# Patient Record
Sex: Female | Born: 1975
Health system: Southern US, Community
[De-identification: ages and names within clinical notes are randomized; demographics above are authoritative.]

## PROBLEM LIST (undated history)

## (undated) DIAGNOSIS — M1990 Unspecified osteoarthritis, unspecified site: Secondary | ICD-10-CM

## (undated) DIAGNOSIS — T8859XA Other complications of anesthesia, initial encounter: Secondary | ICD-10-CM

## (undated) DIAGNOSIS — G4730 Sleep apnea, unspecified: Secondary | ICD-10-CM

## (undated) DIAGNOSIS — D649 Anemia, unspecified: Secondary | ICD-10-CM

## (undated) HISTORY — DX: Anemia, unspecified: D64.9

## (undated) HISTORY — PX: BREAST BIOPSY: SHX20

## (undated) HISTORY — PX: FOOT SURGERY: SHX648

---

## 2015-10-16 ENCOUNTER — Emergency Department (INDEPENDENT_AMBULATORY_CARE_PROVIDER_SITE_OTHER): Payer: Self-pay

## 2015-10-16 ENCOUNTER — Encounter (HOSPITAL_COMMUNITY): Payer: Self-pay | Admitting: Emergency Medicine

## 2015-10-16 ENCOUNTER — Emergency Department (HOSPITAL_COMMUNITY)
Admission: EM | Admit: 2015-10-16 | Discharge: 2015-10-16 | Disposition: A | Discharge status: Home / Self Care | Payer: Self-pay | Source: Home / Self Care | Attending: Family Medicine | Admitting: Family Medicine

## 2015-10-16 DIAGNOSIS — M779 Enthesopathy, unspecified: Secondary | ICD-10-CM

## 2015-10-16 DIAGNOSIS — M7751 Other enthesopathy of right foot: Secondary | ICD-10-CM

## 2015-10-16 MED ORDER — NAPROXEN SODIUM 550 MG PO TABS: 550.0000 mg | ORAL_TABLET | Freq: Two times a day (BID) | ORAL | Status: DC

## 2015-10-16 NOTE — Discharge Instructions (Signed)
  Arthritis Arthritis means joint pain. It can also mean joint disease. A joint is a place where bones come together. People who have arthritis may have:  Red joints.  Swollen joints.  Stiff joints.  Warm joints.  A fever.  A feeling of being sick. HOME CARE Pay attention to any changes in your symptoms. Take these actions to help with your pain and swelling. Medicines  Take over-the-counter and prescription medicines only as told by your doctor.  Do not take aspirin for pain if your doctor says that you may have gout. Activities  Rest your joint if your doctor tells you to.  Avoid activities that make the pain worse.  Exercise your joint regularly as told by your doctor. Try doing exercises like:  Swimming.  Water aerobics.  Biking.  Walking. Joint Care  If your joint is swollen, keep it raised (elevated) if told by your doctor.  If your joint feels stiff in the morning, try taking a warm shower.  If you have diabetes, do not apply heat without asking your doctor.  If told, apply heat to the joint:  Put a towel between the joint and the hot pack or heating pad.  Leave the heat on the area for 20-30 minutes.  If told, apply ice to the joint:  Put ice in a plastic bag.  Place a towel between your skin and the bag.  Leave the ice on for 20 minutes, 2-3 times per day.  Keep all follow-up visits as told by your doctor. GET HELP IF:  The pain gets worse.  You have a fever. GET HELP RIGHT AWAY IF:  You have very bad pain in your joint.  You have swelling in your joint.  Your joint is red.  Many joints become painful and swollen.  You have very bad back pain.  Your leg is very weak.  You cannot control your pee (urine) or poop (stool).   This information is not intended to replace advice given to you by your health care provider. Make sure you discuss any questions you have with your health care provider.   Document Released: 10/15/2009  Document Revised: 04/11/2015 Document Reviewed: 10/16/2014 Elsevier Interactive Patient Education 2016 [employer]  

## 2015-10-16 NOTE — ED Provider Notes (Signed)
CSN: [id number]     Arrival date & time 10/16/15  1738 History   First MD Initiated Contact with Patient 10/16/15 1922     Chief Complaint  Patient presents with  . Ankle Pain   (Consider location/radiation/quality/duration/timing/severity/associated sxs/prior Treatment) HPI History obtained from patient:   LOCATION:right foot SEVERITY:5 DURATION:months, but getting worse CONTEXT:was told she had arthritis in the foot in the past, now working on concrete floors 8 hours a day, pain in foot is worse and now it swells daily QUALITY: MODIFYING FACTORS:massage with alcohol and pressure wrap ASSOCIATED SYMPTOMS:hurts to walk TIMING:episodic OCCUPATION:kitchen worker  History reviewed. No pertinent past medical history. History reviewed. No pertinent past surgical history. History reviewed. No pertinent family history. Social History  Substance Use Topics  . Smoking status: Never Smoker   . Smokeless tobacco: None  . Alcohol Use: Yes   OB History    No data available     Review of Systems Chronic right foot pain Allergies  Review of patient's allergies indicates no known allergies.  Home Medications   Prior to Admission medications   Medication Sig Start Date End Date Taking? Authorizing Provider  naproxen sodium (ANAPROX DS) 550 MG tablet Take 1 tablet (550 mg total) by mouth 2 (two) times daily with a meal. 10/16/15   Tharon Aquas, PA   Meds Ordered and Administered this Visit  Medications - No data to display  BP 117/54 mmHg  Pulse 77  Temp(Src) 97.7 F (36.5 C) (Oral)  Resp 16  SpO2 100%  LMP 10/02/2015 (Exact Date) No data found.   Physical Exam  Constitutional: She appears well-developed. No distress.  Pulmonary/Chest: Effort normal.  Musculoskeletal: Normal range of motion. She exhibits tenderness.       Feet:  Neurological: She is alert.  Skin: Skin is warm and dry.  Psychiatric: She has a normal mood and affect. Her behavior is normal.  Nursing  note and vitals reviewed.   ED Course  Procedures (including critical care time)  Labs Review Labs Reviewed - No data to display  Imaging Review Dg Ankle Complete Right  10/16/2015  CLINICAL DATA:  Bruising and swelling along the side of the foot. Arthritis. EXAM: RIGHT ANKLE - COMPLETE 3+ VIEW COMPARISON:  None. FINDINGS: Inferior spurring from the medial malleolus best appreciated on the lateral projection. Plafond and talar dome intact. Lateral malleolus unremarkable. There is questionable soft tissue swelling below the level of the medial malleolus. Considerable dorsal spurring at the talonavicular articulation, chronic in appearance. Os peroneus noted. Mild plantar and Achilles calcaneal spurs. IMPRESSION: 1. Spurring of the medial malleolus and considerable dorsal spurring of the talonavicular articulation. 2. Smaller plantar and Achilles calcaneal spurs. 3. No acute radiographic findings. Electronically Signed   By: Gaylyn Rong M.D.   On: 10/16/2015 19:55     Visual Acuity Review  Right Eye Distance:   Left Eye Distance:   Bilateral Distance:    Right Eye Near:   Left Eye Near:    Bilateral Near:        Review of XR with patient CAM walker application Refer to ortho MDM   1. Bone spur of right ankle     Patient is reassured that there are no issues that require transfer to higher level of care at this time or additional tests. Patient is advised to continue home symptomatic treatment. Patient is advised that if there are new or worsening symptoms to attend the emergency department, contact primary care provider, or return  to UC. Instructions of care provided discharged home in stable condition. Return to work/school note provided.   THIS NOTE WAS GENERATED USING A VOICE RECOGNITION SOFTWARE PROGRAM. ALL REASONABLE EFFORTS  WERE MADE TO PROOFREAD THIS DOCUMENT FOR ACCURACY.  I have verbally reviewed the discharge instructions with the patient. A printed AVS  was given to the patient.  All questions were answered prior to discharge.      Tharon AquasFrank C Patrick, GeorgiaPA 10/16/15 2034

## 2015-10-16 NOTE — ED Notes (Signed)
[facility] a complaint of right ankle pain that has been ongoing since November. The patient stated that the pain has gotten increasingly worse. The patient denied any known injury but stated that she has a hx of arthritis. There was no obvious swelling noted and good PMS present.

## 2016-09-16 ENCOUNTER — Encounter (HOSPITAL_COMMUNITY): Payer: Self-pay | Admitting: Emergency Medicine

## 2016-09-16 ENCOUNTER — Emergency Department (HOSPITAL_COMMUNITY): Payer: BLUE CROSS/BLUE SHIELD

## 2016-09-16 DIAGNOSIS — M25571 Pain in right ankle and joints of right foot: Secondary | ICD-10-CM | POA: Diagnosis not present

## 2016-09-16 NOTE — ED Triage Notes (Signed)
Arrives with complaint of right ankle pain. States onset 2 days ago. Mild swelling noted to ankle. Area tender. Denies injury.

## 2016-09-17 ENCOUNTER — Emergency Department (HOSPITAL_COMMUNITY)
Admission: EM | Admit: 2016-09-17 | Discharge: 2016-09-17 | Disposition: A | Discharge status: Home / Self Care | Payer: BLUE CROSS/BLUE SHIELD | Attending: Emergency Medicine | Admitting: Emergency Medicine

## 2016-09-17 DIAGNOSIS — M25571 Pain in right ankle and joints of right foot: Secondary | ICD-10-CM

## 2016-09-17 MED ORDER — DICLOFENAC SODIUM 50 MG PO TBEC: 50.0000 mg | DELAYED_RELEASE_TABLET | Freq: Two times a day (BID) | ORAL | 0 refills | Status: DC

## 2016-09-17 NOTE — ED Provider Notes (Signed)
[facility]-EMERGENCY DEPT Provider Note   CSN: [id number] Arrival date & time: 09/16/16  2316     History   Chief Complaint Chief Complaint  Patient presents with  . Ankle Pain    HPI Kathleen Ramos is a 41 y.o. female who presents to the ED with right ankle pain that started 2 days ago. She reports having had ankle problems in the past. She has no known injury to the ankle. The pain is located on the medial aspect of the right ankle.   The history is provided by the patient. No language interpreter was used.  Ankle Pain   The incident occurred 2 days ago. The incident occurred at work. There was no injury mechanism. The pain is present in the right ankle. The quality of the pain is described as aching. The pain is at a severity of 10/10. The pain has been constant since onset. She reports no foreign bodies present. The symptoms are aggravated by bearing weight.  patient reports taking one ibuprofen tablet for pain without relief.   History reviewed. No pertinent past medical history.  There are no active problems to display for this patient.   History reviewed. No pertinent surgical history.  OB History    No data available       Home Medications    Prior to Admission medications   Medication Sig Start Date End Date Taking? Authorizing Provider  diclofenac (VOLTAREN) 50 MG EC tablet Take 1 tablet (50 mg total) by mouth 2 (two) times daily. 09/17/16   Phares Zaccone Orlene OchM Javonda Suh, NP    Family History History reviewed. No pertinent family history.  Social History Social History  Substance Use Topics  . Smoking status: Never Smoker  . Smokeless tobacco: Never Used  . Alcohol use Yes     Allergies   Patient has no known allergies.   Review of Systems Review of Systems  Constitutional: Negative for chills and fever.  Musculoskeletal: Positive for arthralgias. Gait problem: due to pain in right ankle.  Skin: Negative for wound.     Physical Exam Updated Vital  Signs BP 135/69 (BP Location: Left Arm)   Pulse 94   Temp 98.4 F (36.9 C) (Oral)   Resp 20   Ht 5\' 7"  (1.702 m)   Wt 124.3 kg   LMP 08/20/2016 (Approximate)   SpO2 97%   BMI 42.91 kg/m   Physical Exam  Constitutional: She is oriented to person, place, and time. No distress.  obese  HENT:  Head: Normocephalic.  Eyes: EOM are normal.  Neck: Neck supple.  Cardiovascular: Normal rate.   Pulmonary/Chest: Effort normal.  Musculoskeletal: She exhibits no deformity.       Right ankle: She exhibits normal range of motion, no swelling, no ecchymosis, no deformity, no laceration and normal pulse. Tenderness. Medial malleolus tenderness found. Achilles tendon normal.  Pedal pulses 2+, adequate circulation, equal strength in lower extremities. Pain with inversion of the right ankle.  Neurological: She is alert and oriented to person, place, and time. No cranial nerve deficit.  Skin: Skin is warm and dry.  Psychiatric: She has a normal mood and affect. Her behavior is normal.  Nursing note and vitals reviewed.    ED Treatments / Results  Labs (all labs ordered are listed, but only abnormal results are displayed) Labs Reviewed - No data to display  Radiology Dg Ankle Complete Right  Result Date: 09/16/2016 CLINICAL DATA:  Lateral ankle pain for 3 days.  No known injury. EXAM:  RIGHT ANKLE - COMPLETE 3+ VIEW COMPARISON:  10/16/2015 FINDINGS: Degenerative changes in the right ankle with prominent osteophyte formation along the medial malleolus. Degenerative changes in the intertarsal joints. Achilles calcaneal spur. Linear lucency in the body of the talus likely to represent old pin tract. This was present previously. No evidence of acute fracture or dislocation. No focal bone destruction. Soft tissues are unremarkable. IMPRESSION: Degenerative changes in the right ankle. No acute bony abnormalities. Electronically Signed   By: Burman Nieves M.D.   On: 09/16/2016 23:53     Procedures Procedures (including critical care time)  Medications Ordered in ED Medications - No data to display   Initial Impression / Assessment and Plan / ED Course  I have reviewed the triage vital signs and the nursing notes.  Pertinent imaging results that were available during my care of the patient were reviewed by me and considered in my medical decision making (see chart for details).   Final Clinical Impressions(s) / ED Diagnoses  41 y.o. female with right ankle pain stable for d/c without focal neuro deficits and no acute findings on x-ray. Patient given ASO prior to d/c and Rx for NSAIDS.  Ortho referral given and return precautions.   Final diagnoses:  Acute right ankle pain    New Prescriptions New Prescriptions   DICLOFENAC (VOLTAREN) 50 MG EC TABLET    Take 1 tablet (50 mg total) by mouth 2 (two) times daily.     [address] [place], [place] 09/17/16 0143    Dione Booze, MD 09/17/16 [phone]

## 2016-10-01 ENCOUNTER — Encounter: Payer: Self-pay | Admitting: Adult Health

## 2016-10-01 ENCOUNTER — Ambulatory Visit (INDEPENDENT_AMBULATORY_CARE_PROVIDER_SITE_OTHER): Payer: BLUE CROSS/BLUE SHIELD | Admitting: Adult Health

## 2016-10-01 VITALS — BP 108/68 | HR 87 | Ht 67.25 in | Wt 283.7 lb

## 2016-10-01 DIAGNOSIS — N912 Amenorrhea, unspecified: Secondary | ICD-10-CM

## 2016-10-01 DIAGNOSIS — M25571 Pain in right ankle and joints of right foot: Secondary | ICD-10-CM | POA: Diagnosis not present

## 2016-10-01 DIAGNOSIS — Z0000 Encounter for general adult medical examination without abnormal findings: Secondary | ICD-10-CM | POA: Insufficient documentation

## 2016-10-01 DIAGNOSIS — Z833 Family history of diabetes mellitus: Secondary | ICD-10-CM | POA: Diagnosis not present

## 2016-10-01 DIAGNOSIS — R5383 Other fatigue: Secondary | ICD-10-CM

## 2016-10-01 LAB — POCT URINE PREGNANCY: Preg Test, Ur: NEGATIVE

## 2016-10-01 LAB — POCT GLYCOSYLATED HEMOGLOBIN (HGB A1C): Hemoglobin A1C: 5.6

## 2016-10-01 MED ORDER — DICLOFENAC SODIUM 1 % TD GEL: 2.0000 g | Freq: Four times a day (QID) | TRANSDERMAL | 1 refills | Status: DC

## 2016-10-01 NOTE — Assessment & Plan Note (Signed)
Increase regular exercise, recommend 30 mins at least  5 times a week. Increase water intake at least 90 ounces a day-recommended at 140 ounces per her body weight, however she said she would never be able to drink that daily. Schedule fasting lab nurse visit at your convenience.

## 2016-10-01 NOTE — Progress Notes (Signed)
Subjective:    Patient ID: Kathleen Ramos, female    DOB: 06/20/1976, 41 y.o.   MRN: [id number]  HPI:  Ms, Bascom LevelsFrazier presents to establish as a new pt.  She is a pleasant 41 year old.  PMH: Fatigue, Morbid Obesity, and right ankle pain.  She reports > 50 lb weight gain in the last year.  Right ankle pain spontaneously developed last year, denies acute trauma/injury.  Pain is constant, 8/10 localized over top of right ankle.  [facility], next appt. Is in a few weeks. Significant family hx of DM-father, mother, brother.  She works as Government social research officerdietary aid at [employer] and she is getting married this June.    Patient Care Team    Relationship Specialty Notifications Start End  No Pcp Per Patient PCP - General General Practice  10/16/15     Patient Active Problem List   Diagnosis Date Noted  . Family history of diabetes mellitus in first degree relative 10/01/2016  . Other fatigue 10/01/2016  . Routine physical examination 10/01/2016  . Morbid obesity (HCC) 10/01/2016  . Right ankle pain 10/01/2016     History reviewed. No pertinent past medical history.   History reviewed. No pertinent surgical history.   Family History  Problem Relation Age of Onset  . Diabetes Mother   . Diabetes Father   . Stroke Father   . Healthy Sister   . Diabetes Brother   . Stroke Brother   . Seizures Brother   . Healthy Brother      History  Drug Use No     History  Alcohol Use  . Yes    Comment: occassionally     History  Smoking Status  . Never Smoker  Smokeless Tobacco  . Never Used     Outpatient Encounter Prescriptions as of 10/01/2016  Medication Sig  . diclofenac (VOLTAREN) 50 MG EC tablet Take 1 tablet (50 mg total) by mouth 2 (two) times daily.  . diclofenac sodium (VOLTAREN) 1 % GEL Apply 2 g topically 4 (four) times daily.   No facility-administered encounter medications on file as of 10/01/2016.     Allergies: Patient has no  known allergies.  Body mass index is 44.1 kg/m.  Blood pressure 108/68, pulse 87, height 5' 7.25" (1.708 m), weight 283 lb 11.2 oz (128.7 kg), last menstrual period 08/20/2016.    Review of Systems  Constitutional: Positive for activity change, fatigue and unexpected weight change. Negative for appetite change, chills, diaphoresis and fever.  HENT: Negative for congestion.   Eyes: Negative for visual disturbance.  Respiratory: Negative for cough, chest tightness and shortness of breath.   Cardiovascular: Negative for chest pain, palpitations and leg swelling.  Gastrointestinal: Negative for abdominal distention, abdominal pain, constipation, diarrhea, nausea and vomiting.  Endocrine: Negative for cold intolerance, heat intolerance, polydipsia and polyuria.  Genitourinary: Positive for menstrual problem. Negative for difficulty urinating and flank pain.       Two weeks late on menstrual cycle. HCG negative in clinic today.  Musculoskeletal: Positive for arthralgias, gait problem and myalgias. Negative for back pain, joint swelling, neck pain and neck stiffness.  Skin: Negative for color change, pallor, rash and wound.  Neurological: Negative for dizziness, tremors, weakness and headaches.  Psychiatric/Behavioral: Negative for agitation, behavioral problems, self-injury, sleep disturbance and suicidal ideas. The patient is not nervous/anxious and is not hyperactive.        Objective:   Physical Exam  Constitutional: She  appears well-developed and well-nourished. No distress.  HENT:  Head: Normocephalic and atraumatic.  Right Ear: External ear normal.  Left Ear: External ear normal.  Poor dentition.  Eyes: Conjunctivae and EOM are normal. Pupils are equal, round, and reactive to light.  Neck: Normal range of motion. Neck supple.  Cardiovascular: Normal rate, regular rhythm, normal heart sounds and intact distal pulses.   Pulmonary/Chest: Effort normal and breath sounds normal. No  respiratory distress. She has no wheezes. She has no rales. She exhibits no tenderness.  Abdominal: Soft. Bowel sounds are normal. She exhibits no distension. There is no tenderness.  Protuberant abdomin.  Musculoskeletal: Normal range of motion. She exhibits tenderness. She exhibits no edema.       Right ankle: She exhibits normal range of motion, no swelling, no ecchymosis, no deformity, no laceration and normal pulse. Tenderness. Achilles tendon exhibits no pain.  Ankle pain with active ROM, especially during  Inversion. Tenderness noted over dome of talus. No edema, warmth, redness noted.  Lymphadenopathy:    She has no cervical adenopathy.  Skin: She is not diaphoretic.  Nursing note and vitals reviewed.         Assessment & Plan:   1. Amenorrhea   2. Family history of diabetes mellitus in first degree relative   3. Other fatigue   4. Routine physical examination   5. Morbid obesity (HCC)   6. Right ankle pain, unspecified chronicity     Family history of diabetes mellitus in first degree relative A1c today 5.6  Other fatigue Will check TSH.  Routine physical examination Increase regular exercise, recommend 30 mins at least  5 times a week. Increase water intake at least 90 ounces a day-recommended at 140 ounces per her body weight, however she said she would never be able to drink that daily. Schedule fasting lab nurse visit at your convenience.   Morbid obesity (HCC) Increase regular exercise, recommend 30 mins at least  5 times a week. Increase water intake at least 90 ounces a day-recommended at 140 ounces per her body weight, however she said she would never be able to drink that daily.  Right ankle pain  Diclofenac Gel as directed. Continue with Orthopedic Specialist as directed.    FOLLOW-UP:  Return in about 1 year (around 10/01/2017) for Regular Follow Up.

## 2016-10-01 NOTE — Patient Instructions (Addendum)
Heart-Healthy Eating Plan °Heart-healthy meal planning includes: °· Limiting unhealthy fats. °· Increasing healthy fats. °· Making other small dietary changes. °You may need to talk with your doctor or a diet specialist (dietitian) to create an eating plan that is right for you. °What types of fat should I choose? °· Choose healthy fats. These include olive oil and canola oil, flaxseeds, walnuts, almonds, and seeds. °· Eat more omega-3 fats. These include salmon, mackerel, sardines, tuna, flaxseed oil, and ground flaxseeds. Try to eat fish at least twice each week. °· Limit saturated fats. °¨ Saturated fats are often found in animal products, such as meats, butter, and cream. °¨ Plant sources of saturated fats include palm oil, palm kernel oil, and coconut oil. °· Avoid foods with partially hydrogenated oils in them. These include stick margarine, some tub margarines, cookies, crackers, and other baked goods. These contain trans fats. °What general guidelines do I need to follow? °· Check food labels carefully. Identify foods with trans fats or high amounts of saturated fat. °· Fill one half of your plate with vegetables and green salads. Eat 4-5 servings of vegetables per day. A serving of vegetables is: °¨ 1 cup of raw leafy vegetables. °¨ ½ cup of raw or cooked cut-up vegetables. °¨ ½ cup of vegetable juice. °· Fill one fourth of your plate with whole grains. Look for the word "whole" as the first word in the ingredient list. °· Fill one fourth of your plate with lean protein foods. °· Eat 4-5 servings of fruit per day. A serving of fruit is: °¨ One medium whole fruit. °¨ ¼ cup of dried fruit. °¨ ½ cup of fresh, frozen, or canned fruit. °¨ ½ cup of 100% fruit juice. °· Eat more foods that contain soluble fiber. These include apples, broccoli, carrots, beans, peas, and barley. Try to get 20-30 g of fiber per day. °· Eat more home-cooked food. Eat less restaurant, buffet, and fast food. °· Limit or avoid  alcohol. °· Limit foods high in starch and sugar. °· Avoid fried foods. °· Avoid frying your food. Try baking, boiling, grilling, or broiling it instead. You can also reduce fat by: °¨ Removing the skin from poultry. °¨ Removing all visible fats from meats. °¨ Skimming the fat off of stews, soups, and gravies before serving them. °¨ Steaming vegetables in water or broth. °· Lose weight if you are overweight. °· Eat 4-5 servings of nuts, legumes, and seeds per week: °¨ One serving of dried beans or legumes equals ½ cup after being cooked. °¨ One serving of nuts equals 1½ ounces. °¨ One serving of seeds equals ½ ounce or one tablespoon. °· You may need to keep track of how much salt or sodium you eat. This is especially true if you have high blood pressure. Talk with your doctor or dietitian to get more information. °What foods can I eat? °Grains  °Breads, including French, white, pita, wheat, raisin, rye, oatmeal, and Italian. Tortillas that are neither fried nor made with lard or trans fat. Low-fat rolls, including hotdog and hamburger buns and English muffins. Biscuits. Muffins. Waffles. Pancakes. Light popcorn. Whole-grain cereals. Flatbread. Melba toast. Pretzels. Breadsticks. Rusks. Low-fat snacks. Low-fat crackers, including oyster, saltine, matzo, graham, animal, and rye. Rice and pasta, including [place] rice and pastas that are made with whole wheat. °Vegetables  °All vegetables. °Fruits  °All fruits, but limit coconut. °Meats and Other Protein Sources  °Lean, well-trimmed beef, veal, pork, and lamb. Chicken and turkey without skin. All   fish and shellfish. Wild duck, rabbit, pheasant, and venison. Egg whites or low-cholesterol egg substitutes. Dried beans, peas, lentils, and tofu. Seeds and most nuts. Dairy  Low-fat or nonfat cheeses, including ricotta, string, and mozzarella. Skim or 1% milk that is liquid, powdered, or evaporated. Buttermilk that is made with low-fat milk. Nonfat or low-fat  yogurt. Beverages  Mineral water. Diet carbonated beverages. Sweets and Desserts  Sherbets and fruit ices. Honey, jam, marmalade, jelly, and syrups. Meringues and gelatins. Pure sugar candy, such as hard candy, jelly beans, gumdrops, mints, marshmallows, and small amounts of dark chocolate. MGM MIRAGE[employer]el food cake. Eat all sweets and desserts in moderation. Fats and Oils  Nonhydrogenated (trans-free) margarines. Vegetable oils, including soybean, sesame, sunflower, olive, peanut, safflower, corn, canola, and cottonseed. Salad dressings or mayonnaise made with a vegetable oil. Limit added fats and oils that you use for cooking, baking, salads, and as spreads. Other  Cocoa powder. Coffee and tea. All seasonings and condiments. The items listed above may not be a complete list of recommended foods or beverages. Contact your dietitian for more options.  What foods are not recommended? Grains  Breads that are made with saturated or trans fats, oils, or whole milk. Croissants. Butter rolls. Cheese breads. Sweet rolls. Donuts. Buttered popcorn. Chow mein noodles. High-fat crackers, such as cheese or butter crackers. Meats and Other Protein Sources  Fatty meats, such as hotdogs, short ribs, sausage, spareribs, bacon, rib eye roast or steak, and mutton. High-fat deli meats, such as salami and bologna. Caviar. Domestic duck and goose. Organ meats, such as kidney, liver, sweetbreads, and heart. Dairy  Cream, sour cream, cream cheese, and creamed cottage cheese. Whole-milk cheeses, including blue (bleu), [address] [place], [place], [place], [address], [place], [address], cheddar, [place], and [place]. Whole or 2% milk that is liquid, evaporated, or condensed. Whole buttermilk. Cream sauce or high-fat cheese sauce. Yogurt that is made from whole milk. Beverages  Regular sodas and juice drinks with added sugar. Sweets and Desserts  Frosting. Pudding. Cookies. Cakes other than angel food cake. Candy that has milk chocolate or  white chocolate, hydrogenated fat, butter, coconut, or unknown ingredients. Buttered syrups. Full-fat ice cream or ice cream drinks. Fats and Oils  Gravy that has suet, meat fat, or shortening. Cocoa butter, hydrogenated oils, palm oil, coconut oil, palm kernel oil. These can often be found in baked products, candy, fried foods, nondairy creamers, and whipped toppings. Solid fats and shortenings, including bacon fat, salt pork, lard, and butter. Nondairy cream substitutes, such as coffee creamers and sour cream substitutes. Salad dressings that are made of unknown oils, cheese, or sour cream. The items listed above may not be a complete list of foods and beverages to avoid. Contact your dietitian for more information.  This information is not intended to replace advice given to you by your health care provider. Make sure you discuss any questions you have with your health care provider. Document Released: 01/20/2012 Document Revised: 12/27/2015 Document Reviewed: 01/12/2014 Elsevier Interactive Patient Education  2017 Elsevier Inc.  Increase regular exercise, recommend 30 mins at least 5 times a week. Increase water intake at least 90 ounces a day. Continue with Orthopedic Specialist as directed. Please schedule fasting lab nurse visit at your convenience. Annual follow-up, sooner if needed.

## 2016-10-01 NOTE — Assessment & Plan Note (Addendum)
Increase regular exercise, recommend 30 mins at least  5 times a week. Increase water intake at least 90 ounces a day-recommended at 140 ounces per her body weight, however she said she would never be able to drink that daily.

## 2016-10-01 NOTE — Assessment & Plan Note (Signed)
A1c today 5.6

## 2016-10-01 NOTE — Assessment & Plan Note (Signed)
Will check TSH

## 2016-10-01 NOTE — Assessment & Plan Note (Signed)
Diclofenac Gel as directed. Continue with Orthopedic Specialist as directed.

## 2016-10-21 ENCOUNTER — Other Ambulatory Visit (INDEPENDENT_AMBULATORY_CARE_PROVIDER_SITE_OTHER): Payer: BLUE CROSS/BLUE SHIELD

## 2016-10-21 DIAGNOSIS — R5383 Other fatigue: Secondary | ICD-10-CM

## 2016-10-21 DIAGNOSIS — Z0000 Encounter for general adult medical examination without abnormal findings: Secondary | ICD-10-CM

## 2016-10-22 ENCOUNTER — Other Ambulatory Visit: Payer: Self-pay | Admitting: Adult Health

## 2016-10-22 DIAGNOSIS — E559 Vitamin D deficiency, unspecified: Secondary | ICD-10-CM

## 2016-10-22 LAB — COMPREHENSIVE METABOLIC PANEL
ALT: 8 IU/L (ref 0–32)
AST: 26 IU/L (ref 0–40)
Albumin/Globulin Ratio: 1.2 (ref 1.2–2.2)
Albumin: 3.9 g/dL (ref 3.5–5.5)
Alkaline Phosphatase: 59 IU/L (ref 39–117)
BILIRUBIN TOTAL: 0.3 mg/dL (ref 0.0–1.2)
BUN/Creatinine Ratio: 23 (ref 9–23)
BUN: 13 mg/dL (ref 6–20)
CHLORIDE: 107 mmol/L — AB (ref 96–106)
CO2: 19 mmol/L (ref 18–29)
Calcium: 9.3 mg/dL (ref 8.7–10.2)
Creatinine, Ser: 0.56 mg/dL — ABNORMAL LOW (ref 0.57–1.00)
GFR calc non Af Amer: 119 mL/min/{1.73_m2} (ref >59)
GFR, EST AFRICAN AMERICAN: 137 mL/min/{1.73_m2} (ref >59)
GLOBULIN, TOTAL: 3.2 g/dL (ref 1.5–4.5)
Glucose: 104 mg/dL — ABNORMAL HIGH (ref 65–99)
Potassium: 5.4 mmol/L — ABNORMAL HIGH (ref 3.5–5.2)
SODIUM: 145 mmol/L — AB (ref 134–144)
Total Protein: 7.1 g/dL (ref 6.0–8.5)

## 2016-10-22 LAB — TSH: TSH: 1.88 u[IU]/mL (ref 0.450–4.500)

## 2016-10-22 LAB — LIPID PANEL
CHOL/HDL RATIO: 3.9 ratio (ref 0.0–4.4)
Cholesterol, Total: 228 mg/dL — ABNORMAL HIGH (ref 100–199)
HDL: 58 mg/dL (ref >39)
LDL Calculated: 150 mg/dL — ABNORMAL HIGH (ref 0–99)
TRIGLYCERIDES: 98 mg/dL (ref 0–149)
VLDL Cholesterol Cal: 20 mg/dL (ref 5–40)

## 2016-10-22 LAB — CBC WITH DIFFERENTIAL/PLATELET

## 2016-10-22 LAB — VITAMIN D 25 HYDROXY (VIT D DEFICIENCY, FRACTURES): VIT D 25 HYDROXY: 9.6 ng/mL — AB (ref 30.0–100.0)

## 2016-10-22 MED ORDER — VITAMIN D (ERGOCALCIFEROL) 1.25 MG (50000 UNIT) PO CAPS: 50000.0000 [IU] | ORAL_CAPSULE | ORAL | 0 refills | Status: DC

## 2016-10-22 NOTE — Progress Notes (Unsigned)
ergo 

## 2018-10-01 DIAGNOSIS — M810 Age-related osteoporosis without current pathological fracture: Secondary | ICD-10-CM | POA: Insufficient documentation

## 2019-05-23 ENCOUNTER — Other Ambulatory Visit: Payer: Self-pay

## 2019-05-23 ENCOUNTER — Ambulatory Visit (INDEPENDENT_AMBULATORY_CARE_PROVIDER_SITE_OTHER): Payer: Managed Care, Other (non HMO) | Admitting: Adult Health

## 2019-05-23 ENCOUNTER — Encounter: Payer: Self-pay | Admitting: Adult Health

## 2019-05-23 VITALS — BP 131/79 | HR 69 | Temp 98.9°F | Ht 67.5 in | Wt 267.2 lb

## 2019-05-23 DIAGNOSIS — R0683 Snoring: Secondary | ICD-10-CM | POA: Diagnosis not present

## 2019-05-23 DIAGNOSIS — E559 Vitamin D deficiency, unspecified: Secondary | ICD-10-CM

## 2019-05-23 DIAGNOSIS — R400 Somnolence: Secondary | ICD-10-CM

## 2019-05-23 DIAGNOSIS — Z0000 Encounter for general adult medical examination without abnormal findings: Secondary | ICD-10-CM | POA: Insufficient documentation

## 2019-05-23 DIAGNOSIS — R0681 Apnea, not elsewhere classified: Secondary | ICD-10-CM

## 2019-05-23 DIAGNOSIS — Z6838 Body mass index (BMI) 38.0-38.9, adult: Secondary | ICD-10-CM | POA: Insufficient documentation

## 2019-05-23 DIAGNOSIS — Z23 Encounter for immunization: Secondary | ICD-10-CM

## 2019-05-23 DIAGNOSIS — Z1231 Encounter for screening mammogram for malignant neoplasm of breast: Secondary | ICD-10-CM

## 2019-05-23 DIAGNOSIS — Z6841 Body Mass Index (BMI) 40.0 and over, adult: Secondary | ICD-10-CM

## 2019-05-23 NOTE — Assessment & Plan Note (Signed)
Continue your excellent water intake, regular walking, and follow Mediterranean diet. Referral [facility]

## 2019-05-23 NOTE — Progress Notes (Signed)
Subjective:    Patient ID: Kathleen Ramos, female    DOB: [birth year], 43 y.o.   MRN: [id number]  HPI: 10/01/2016 OV- Pt was formerly known as Engineer, manufacturing-  Ms, Bascom Levels presents to establish as a new pt.  She is a pleasant 43 year old.  PMH: Fatigue, Morbid Obesity, and right ankle pain.  She reports > 50 lb weight gain in the last year.  Right ankle pain spontaneously developed last year, denies acute trauma/injury.  Pain is constant, 8/10 localized over top of right ankle.  She is being treated by [facility], next appt. Is in a few weeks. Significant family hx of DM-father, mother, brother.  She works as Government social research officer at [employer] and she is getting married this June. [name] is here to establish as a new pt.  She is a pleasant 43 year old female. PMH: [name] is here to re-establish as a pt, has not been seen for >2.5 years. She reports re-gaining the 30 lbs that she lost 2 years ago due to poor diet. She continues to walk >10K steps 5 days/week when she works- [employer]  She additionally walks 2-3 miles at [employer] track twice a week. She drinks >gallon water/day. She reports excessive daytime drowsiness and states "my husband says I snore really bad". She reports that she will often fall asleep when seated comfortably after a few moments. She reports witnessed apnea by her husband. She has never had a sleep study  Patient Care Team    Relationship Specialty Notifications Start End  Julaine Fusi, NP PCP - General Family Medicine  05/23/19     Patient Active Problem List   Diagnosis Date Noted  . Healthcare maintenance 05/23/2019  . BMI 40.0-44.9, adult ([facility]) 05/23/2019  . Snoring 05/23/2019     History reviewed. No pertinent past medical history.   History reviewed. No pertinent surgical history.   Family History  Problem Relation Age of Onset  . Diabetes Mother   . Hyperlipidemia Mother   . Hypertension Mother      Social  History   Substance and Sexual Activity  Drug Use Never     Social History   Substance and Sexual Activity  Alcohol Use None     Social History   Tobacco Use  Smoking Status Never Smoker  Smokeless Tobacco Never Used     No outpatient encounter medications on file as of 05/23/2019.   No facility-administered encounter medications on file as of 05/23/2019.     Allergies: Patient has no known allergies.  Body mass index is 41.23 kg/m.  Blood pressure 131/79, pulse 69, temperature 98.9 F (37.2 C), temperature source Oral, height 5' 7.5" (1.715 m), weight 267 lb 3.2 oz (121.2 kg), last menstrual period 04/28/2019, SpO2 98 %.    Patient Care Team    Relationship Specialty Notifications Start End  Julaine Fusi, NP PCP - General Family Medicine  05/23/19     Patient Active Problem List   Diagnosis Date Noted  . Healthcare maintenance 05/23/2019  . BMI 40.0-44.9, adult (HCC) 05/23/2019  . Snoring 05/23/2019     History reviewed. No pertinent past medical history.   History reviewed. No pertinent surgical history.   Family History  Problem Relation Age of Onset  . Diabetes Mother   . Hyperlipidemia Mother   . Hypertension Mother      Social History   Substance and Sexual Activity  Drug Use Never  Social History   Substance and Sexual Activity  Alcohol Use None     Social History   Tobacco Use  Smoking Status Never Smoker  Smokeless Tobacco Never Used     No outpatient encounter medications on file as of 05/23/2019.   No facility-administered encounter medications on file as of 05/23/2019.     Allergies: Patient has no known allergies.  Body mass index is 41.23 kg/m.  Blood pressure 131/79, pulse 69, temperature 98.9 F (37.2 C), temperature source Oral, height 5' 7.5" (1.715 m), weight 267 lb 3.2 oz (121.2 kg), last menstrual period 04/28/2019, SpO2 98 %.     Review of Systems  Constitutional: Positive for fatigue.  Negative for activity change, appetite change, chills, diaphoresis, fever and unexpected weight change.  Eyes: Negative for visual disturbance.  Respiratory: Negative for cough, chest tightness, shortness of breath, wheezing and stridor.   Cardiovascular: Negative for chest pain, palpitations and leg swelling.  Gastrointestinal: Negative for abdominal distention, anal bleeding, blood in stool, constipation, diarrhea and nausea.  Endocrine: Negative for polydipsia, polyphagia and polyuria.  Neurological: Negative for dizziness and headaches.  Psychiatric/Behavioral: Positive for sleep disturbance. Negative for agitation, behavioral problems, confusion, decreased concentration, dysphoric mood, hallucinations, self-injury and suicidal ideas. The patient is not nervous/anxious and is not hyperactive.        Objective:   Physical Exam Vitals signs and nursing note reviewed.  Constitutional:      General: She is not in acute distress.    Appearance: Normal appearance. She is obese. She is not ill-appearing, toxic-appearing or diaphoretic.  HENT:     Head: Normocephalic and atraumatic.  Cardiovascular:     Rate and Rhythm: Normal rate and regular rhythm.     Pulses: Normal pulses.     Heart sounds: Normal heart sounds. No murmur. No friction rub. No gallop.   Pulmonary:     Effort: Pulmonary effort is normal. No respiratory distress.     Breath sounds: Normal breath sounds. No stridor. No wheezing, rhonchi or rales.  Chest:     Chest wall: No tenderness.  Skin:    Capillary Refill: Capillary refill takes less than 2 seconds.  Neurological:     Mental Status: She is alert and oriented to person, place, and time.  Psychiatric:        Mood and Affect: Mood normal.        Behavior: Behavior normal.        Thought Content: Thought content normal.        Judgment: Judgment normal.       Assessment & Plan:   1. Need for influenza vaccination   2. Need for Tdap vaccination   3.  Healthcare maintenance   4. Encounter for screening mammogram for malignant neoplasm of breast   5. Snoring   6. Has daytime drowsiness   7. Witnessed episode of apnea   8. BMI 40.0-44.9, adult ([place])   9. Vitamin D deficiency     Healthcare maintenance  Continue your excellent water intake, regular walking, and follow Mediterranean diet. We will call you when your lab results are available. Referral placed- 1) Sleep Study 2) [facility] 3) OB/GYN Follow-up in 2 months for complete physical. Continue to social distance and wear a mask when in clinic.  BMI 40.0-44.9, adult [facility]) Continue your excellent water intake, regular walking, and follow Mediterranean diet. Referral to [facility]   Snoring +BMI 41 + witnessed apnea by husband Continue  your excellent water intake, regular walking, and follow Mediterranean diet. Referral placed- Sleep Study     FOLLOW-UP:  Return in about 2 months (around 07/23/2019) for CPE.

## 2019-05-23 NOTE — Assessment & Plan Note (Signed)
  Continue your excellent water intake, regular walking, and follow Mediterranean diet. We will call you when your lab results are available. Referral placed- 1) Sleep Study 2) [facility] 3) OB/GYN Follow-up in 2 months for complete physical. Continue to social distance and wear a mask when in clinic.

## 2019-05-23 NOTE — Patient Instructions (Signed)
Mediterranean Diet A Mediterranean diet refers to food and lifestyle choices that are based on the traditions of countries located on the [employer]. This way of eating has been shown to help prevent certain conditions and improve outcomes for people who have chronic diseases, like kidney disease and heart disease. What are tips for following this plan? Lifestyle  Cook and eat meals together with your family, when possible.  Drink enough fluid to keep your urine clear or pale yellow.  Be physically active every day. This includes: ? Aerobic exercise like running or swimming. ? Leisure activities like gardening, walking, or housework.  Get 7-8 hours of sleep each night.  If recommended by your health care provider, drink red wine in moderation. This means 1 glass a day for nonpregnant women and 2 glasses a day for men. A glass of wine equals 5 oz (150 mL). Reading food labels   Check the serving size of packaged foods. For foods such as rice and pasta, the serving size refers to the amount of cooked product, not dry.  Check the total fat in packaged foods. Avoid foods that have saturated fat or trans fats.  Check the ingredients list for added sugars, such as corn syrup. Shopping  At the grocery store, buy most of your food from the areas near the walls of the store. This includes: ? Fresh fruits and vegetables (produce). ? Grains, beans, nuts, and seeds. Some of these may be available in unpackaged forms or large amounts (in bulk). ? Fresh seafood. ? Poultry and eggs. ? Low-fat dairy products.  Buy whole ingredients instead of prepackaged foods.  Buy fresh fruits and vegetables in-season from local farmers markets.  Buy frozen fruits and vegetables in resealable bags.  If you do not have access to quality fresh seafood, buy precooked frozen shrimp or canned fish, such as tuna, salmon, or sardines.  Buy small amounts of raw or cooked vegetables, salads, or olives from  the deli or salad bar at your store.  Stock your pantry so you always have certain foods on hand, such as olive oil, canned tuna, canned tomatoes, rice, pasta, and beans. Cooking  Cook foods with extra-virgin olive oil instead of using butter or other vegetable oils.  Have meat as a side dish, and have vegetables or grains as your main dish. This means having meat in small portions or adding small amounts of meat to foods like pasta or stew.  Use beans or vegetables instead of meat in common dishes like chili or lasagna.  Experiment with different cooking methods. Try roasting or broiling vegetables instead of steaming or sauteing them.  Add frozen vegetables to soups, stews, pasta, or rice.  Add nuts or seeds for added healthy fat at each meal. You can add these to yogurt, salads, or vegetable dishes.  Marinate fish or vegetables using olive oil, lemon juice, garlic, and fresh herbs. Meal planning   Plan to eat 1 vegetarian meal one day each week. Try to work up to 2 vegetarian meals, if possible.  Eat seafood 2 or more times a week.  Have healthy snacks readily available, such as: ? Vegetable sticks with hummus. ? [place] yogurt. ? Fruit and nut trail mix.  Eat balanced meals throughout the week. This includes: ? Fruit: 2-3 servings a day ? Vegetables: 4-5 servings a day ? Low-fat dairy: 2 servings a day ? Fish, poultry, or lean meat: 1 serving a day ? Beans and legumes: 2 or more servings a week ?  Nuts and seeds: 1-2 servings a day ? Whole grains: 6-8 servings a day ? Extra-virgin olive oil: 3-4 servings a day  Limit red meat and sweets to only a few servings a month What are my food choices?  Mediterranean diet ? Recommended  Grains: Whole-grain pasta. Bilello rice. Bulgar wheat. Polenta. Couscous. Whole-wheat bread. [place].  Vegetables: Artichokes. Beets. Broccoli. Cabbage. Carrots. Eggplant. Green beans. Chard. Kale. Spinach. Onions. Leeks. Peas. Squash.  Tomatoes. Peppers. Radishes.  Fruits: Apples. Apricots. Avocado. Berries. Bananas. Cherries. Dates. Figs. Grapes. Lemons. Melon. Oranges. Peaches. Plums. Pomegranate.  Meats and other protein foods: Beans. Almonds. Sunflower seeds. Pine nuts. Peanuts. [place]. Salmon. Scallops. Shrimp. [place]. Tilapia. Clams. Oysters. Eggs.  Dairy: Low-fat milk. Cheese. Greek yogurt.  Beverages: Water. Red wine. Herbal tea.  Fats and oils: Extra virgin olive oil. Avocado oil. Grape seed oil.  Sweets and desserts: [place] yogurt with honey. Baked apples. Poached pears. Trail mix.  Seasoning and other foods: Basil. Cilantro. Coriander. Cumin. Mint. Parsley. Sage. Rosemary. Tarragon. Garlic. Oregano. Thyme. Pepper. Balsalmic vinegar. Tahini. Hummus. Tomato sauce. Olives. Mushrooms. ? Limit these  Grains: Prepackaged pasta or rice dishes. Prepackaged cereal with added sugar.  Vegetables: Deep fried potatoes (french fries).  Fruits: Fruit canned in syrup.  Meats and other protein foods: Beef. Pork. Lamb. Poultry with skin. Hot dogs. Bernie[place] Salines.  Dairy: Ice cream. Sour cream. Whole milk.  Beverages: Juice. Sugar-sweetened soft drinks. Beer. Liquor and spirits.  Fats and oils: Butter. Canola oil. Vegetable oil. Beef fat (tallow). Lard.  Sweets and desserts: Cookies. Cakes. Pies. Candy.  Seasoning and other foods: Mayonnaise. Premade sauces and marinades. The items listed may not be a complete list. Talk with your dietitian about what dietary choices are right for you. Summary  The Mediterranean diet includes both food and lifestyle choices.  Eat a variety of fresh fruits and vegetables, beans, nuts, seeds, and whole grains.  Limit the amount of red meat and sweets that you eat.  Talk with your health care provider about whether it is safe for you to drink red wine in moderation. This means 1 glass a day for nonpregnant women and 2 glasses a day for men. A glass of wine equals 5 oz (150 mL). This information  is not intended to replace advice given to you by your health care provider. Make sure you discuss any questions you have with your health care provider. Document Released: 03/13/2016 Document Revised: 03/20/2016 Document Reviewed: 03/13/2016 Elsevier Patient Education  2020 [place] your excellent water intake, regular walking, and follow Mediterranean diet. We will call you when your lab results are available. Referral placed- 1) Sleep Study 2) [facility] 3) OB/GYN Follow-up in 2 months for complete physical. Continue to social distance and wear a mask when in clinic. WELCOME BACK TO THE PRACTICE!

## 2019-05-23 NOTE — Assessment & Plan Note (Addendum)
+  BMI 41 + witnessed apnea by husband Continue your excellent water intake, regular walking, and follow Mediterranean diet. Referral placed- Sleep Study

## 2019-05-24 ENCOUNTER — Encounter: Payer: Self-pay | Admitting: Adult Health

## 2019-05-24 ENCOUNTER — Other Ambulatory Visit: Payer: Self-pay

## 2019-05-24 ENCOUNTER — Other Ambulatory Visit: Payer: Self-pay | Admitting: Adult Health

## 2019-05-24 DIAGNOSIS — R0683 Snoring: Secondary | ICD-10-CM

## 2019-05-24 LAB — VITAMIN D 25 HYDROXY (VIT D DEFICIENCY, FRACTURES): Vit D, 25-Hydroxy: 7.6 ng/mL — ABNORMAL LOW (ref 30.0–100.0)

## 2019-05-24 LAB — LIPID PANEL
Chol/HDL Ratio: 3.6 ratio (ref 0.0–4.4)
Cholesterol, Total: 206 mg/dL — ABNORMAL HIGH (ref 100–199)
HDL: 58 mg/dL (ref >39)
LDL Chol Calc (NIH): 134 mg/dL — ABNORMAL HIGH (ref 0–99)
Triglycerides: 77 mg/dL (ref 0–149)
VLDL Cholesterol Cal: 14 mg/dL (ref 5–40)

## 2019-05-24 LAB — CBC WITH DIFFERENTIAL/PLATELET
Basophils Absolute: 0 10*3/uL (ref 0.0–0.2)
Basos: 1 %
EOS (ABSOLUTE): 0.2 10*3/uL (ref 0.0–0.4)
Eos: 3 %
Hematocrit: 36.7 % (ref 34.0–46.6)
Hemoglobin: 11.9 g/dL (ref 11.1–15.9)
Immature Grans (Abs): 0 10*3/uL (ref 0.0–0.1)
Immature Granulocytes: 0 %
Lymphocytes Absolute: 2.3 10*3/uL (ref 0.7–3.1)
Lymphs: 35 %
MCH: 26.7 pg (ref 26.6–33.0)
MCHC: 32.4 g/dL (ref 31.5–35.7)
MCV: 82 fL (ref 79–97)
Monocytes Absolute: 0.4 10*3/uL (ref 0.1–0.9)
Monocytes: 6 %
Neutrophils Absolute: 3.7 10*3/uL (ref 1.4–7.0)
Neutrophils: 55 %
Platelets: 246 10*3/uL (ref 150–450)
RBC: 4.46 x10E6/uL (ref 3.77–5.28)
RDW: 14.3 % (ref 11.7–15.4)
WBC: 6.6 10*3/uL (ref 3.4–10.8)

## 2019-05-24 LAB — COMPREHENSIVE METABOLIC PANEL
ALT: 14 IU/L (ref 0–32)
AST: 16 IU/L (ref 0–40)
Albumin/Globulin Ratio: 1.4 (ref 1.2–2.2)
Albumin: 4.1 g/dL (ref 3.8–4.8)
Alkaline Phosphatase: 67 IU/L (ref 39–117)
BUN/Creatinine Ratio: 15 (ref 9–23)
BUN: 9 mg/dL (ref 6–24)
Bilirubin Total: 0.3 mg/dL (ref 0.0–1.2)
CO2: 24 mmol/L (ref 20–29)
Calcium: 9.1 mg/dL (ref 8.7–10.2)
Chloride: 105 mmol/L (ref 96–106)
Creatinine, Ser: 0.6 mg/dL (ref 0.57–1.00)
GFR calc Af Amer: 131 mL/min/{1.73_m2} (ref >59)
GFR calc non Af Amer: 114 mL/min/{1.73_m2} (ref >59)
Globulin, Total: 3 g/dL (ref 1.5–4.5)
Glucose: 84 mg/dL (ref 65–99)
Potassium: 5 mmol/L (ref 3.5–5.2)
Sodium: 140 mmol/L (ref 134–144)
Total Protein: 7.1 g/dL (ref 6.0–8.5)

## 2019-05-24 LAB — TSH: TSH: 0.963 u[IU]/mL (ref 0.450–4.500)

## 2019-05-24 LAB — HEMOGLOBIN A1C
Est. average glucose Bld gHb Est-mCnc: 114 mg/dL
Hgb A1c MFr Bld: 5.6 % (ref 4.8–5.6)

## 2019-05-24 NOTE — Progress Notes (Signed)
Per [place], needed to correct order for sleep study to an ambulatory referral to [facility] Sleep Studies.  Referral entered.  Charyl Bigger, CMA

## 2019-06-02 ENCOUNTER — Ambulatory Visit
Admission: RE | Admit: 2019-06-02 | Discharge: 2019-06-02 | Disposition: A | Discharge status: Home / Self Care | Payer: Managed Care, Other (non HMO) | Source: Ambulatory Visit | Attending: Adult Health | Admitting: Adult Health

## 2019-06-02 ENCOUNTER — Other Ambulatory Visit (HOSPITAL_COMMUNITY)
Admission: RE | Admit: 2019-06-02 | Discharge: 2019-06-02 | Disposition: A | Discharge status: Home / Self Care | Payer: Managed Care, Other (non HMO) | Source: Ambulatory Visit | Attending: Obstetrics and Gynecology | Admitting: Obstetrics and Gynecology

## 2019-06-02 ENCOUNTER — Encounter: Payer: Self-pay | Admitting: Obstetrics and Gynecology

## 2019-06-02 ENCOUNTER — Other Ambulatory Visit: Payer: Self-pay

## 2019-06-02 ENCOUNTER — Ambulatory Visit (INDEPENDENT_AMBULATORY_CARE_PROVIDER_SITE_OTHER): Payer: Managed Care, Other (non HMO) | Admitting: Obstetrics and Gynecology

## 2019-06-02 VITALS — BP 130/80 | HR 64 | Temp 97.1°F | Ht 68.0 in | Wt 271.2 lb

## 2019-06-02 DIAGNOSIS — Z124 Encounter for screening for malignant neoplasm of cervix: Secondary | ICD-10-CM | POA: Insufficient documentation

## 2019-06-02 DIAGNOSIS — N814 Uterovaginal prolapse, unspecified: Secondary | ICD-10-CM | POA: Diagnosis not present

## 2019-06-02 DIAGNOSIS — Z01419 Encounter for gynecological examination (general) (routine) without abnormal findings: Secondary | ICD-10-CM

## 2019-06-02 DIAGNOSIS — Z6841 Body Mass Index (BMI) 40.0 and over, adult: Secondary | ICD-10-CM | POA: Diagnosis not present

## 2019-06-02 DIAGNOSIS — Z1231 Encounter for screening mammogram for malignant neoplasm of breast: Secondary | ICD-10-CM

## 2019-06-02 NOTE — Patient Instructions (Signed)
EXERCISE AND DIET:  We recommended that you start or continue a regular exercise program for good health. Regular exercise means any activity that makes your heart beat faster and makes you sweat.  We recommend exercising at least 30 minutes per day at least 3 days a week, preferably 4 or 5.  We also recommend a diet low in fat and sugar.  Inactivity, poor dietary choices and obesity can cause diabetes, heart attack, stroke, and kidney damage, among others.    ALCOHOL AND SMOKING:  Women should limit their alcohol intake to no more than 7 drinks/beers/glasses of wine (combined, not each!) per week. Moderation of alcohol intake to this level decreases your risk of breast cancer and liver damage. And of course, no recreational drugs are part of a healthy lifestyle.  And absolutely no smoking or even second hand smoke. Most people know smoking can cause heart and lung diseases, but did you know it also contributes to weakening of your bones? Aging of your skin?  Yellowing of your teeth and nails?  CALCIUM AND VITAMIN D:  Adequate intake of calcium and Vitamin D are recommended.  The recommendations for exact amounts of these supplements seem to change often, but generally speaking 1,000 mg of calcium (between diet and supplement) and 800 units of Vitamin D per day seems prudent. Certain women may benefit from higher intake of Vitamin D.  If you are among these women, your doctor will have told you during your visit.    PAP SMEARS:  Pap smears, to check for cervical cancer or precancers,  have traditionally been done yearly, although recent scientific advances have shown that most women can have pap smears less often.  However, every woman still should have a physical exam from her gynecologist every year. It will include a breast check, inspection of the vulva and vagina to check for abnormal growths or skin changes, a visual exam of the cervix, and then an exam to evaluate the size and shape of the uterus and  ovaries.  And after 41 years of age, a rectal exam is indicated to check for rectal cancers. We will also provide age appropriate advice regarding health maintenance, like when you should have certain vaccines, screening for sexually transmitted diseases, bone density testing, colonoscopy, mammograms, etc.   MAMMOGRAMS:  All women over 75 years old should have a yearly mammogram. Many facilities now offer a "3D" mammogram, which may cost around $50 extra out of pocket. If possible,  we recommend you accept the option to have the 3D mammogram performed.  It both reduces the number of women who will be called back for extra views which then turn out to be normal, and it is better than the routine mammogram at detecting truly abnormal areas.    COLON CANCER SCREENING: Now recommend starting at age 57. At this time colonoscopy is not covered for routine screening until 50. There are take home tests that can be done between 45-49.   COLONOSCOPY:  Colonoscopy to screen for colon cancer is recommended for all women at age 91.  We know, you hate the idea of the prep.  We agree, BUT, having colon cancer and not knowing it is worse!!  Colon cancer so often starts as a polyp that can be seen and removed at colonscopy, which can quite literally save your life!  And if your first colonoscopy is normal and you have no family history of colon cancer, most women don't have to have it again for  10 years.  Once every ten years, you can do something that may end up saving your life, right?  We will be happy to help you get it scheduled when you are ready.  Be sure to check your insurance coverage so you understand how much it will cost.  It may be covered as a preventative service at no cost, but you should check your particular policy.   ° ° ° °Breast Self-Awareness °Breast self-awareness means being familiar with how your breasts look and feel. It involves checking your breasts regularly and reporting any changes to your  health care provider. °Practicing breast self-awareness is important. A change in your breasts can be a sign of a serious medical problem. Being familiar with how your breasts look and feel allows you to find any problems early, when treatment is more likely to be successful. All women should practice breast self-awareness, including women who have had breast implants. °How to do a breast self-exam °One way to learn what is normal for your breasts and whether your breasts are changing is to do a breast self-exam. To do a breast self-exam: °Look for Changes ° °1. Remove all the clothing above your waist. °2. Stand in front of a mirror in a room with good lighting. °3. Put your hands on your hips. °4. Push your hands firmly downward. °5. Compare your breasts in the mirror. Look for differences between them (asymmetry), such as: °? Differences in shape. °? Differences in size. °? Puckers, dips, and bumps in one breast and not the other. °6. Look at each breast for changes in your skin, such as: °? Redness. °? Scaly areas. °7. Look for changes in your nipples, such as: °? Discharge. °? Bleeding. °? Dimpling. °? Redness. °? A change in position. °Feel for Changes °Carefully feel your breasts for lumps and changes. It is best to do this while lying on your back on the floor and again while sitting or standing in the shower or tub with soapy water on your skin. Feel each breast in the following way: °· Place the arm on the side of the breast you are examining above your head. °· Feel your breast with the other hand. °· Start in the nipple area and make ¾ inch (2 cm) overlapping circles to feel your breast. Use the pads of your three middle fingers to do this. Apply light pressure, then medium pressure, then firm pressure. The light pressure will allow you to feel the tissue closest to the skin. The medium pressure will allow you to feel the tissue that is a little deeper. The firm pressure will allow you to feel the tissue  close to the ribs. °· Continue the overlapping circles, moving downward over the breast until you feel your ribs below your breast. °· Move one finger-width toward the center of the body. Continue to use the ¾ inch (2 cm) overlapping circles to feel your breast as you move slowly up toward your collarbone. °· Continue the up and down exam using all three pressures until you reach your armpit. ° °Write Down What You Find ° °Write down what is normal for each breast and any changes that you find. Keep a written record with breast changes or normal findings for each breast. By writing this information down, you do not need to depend only on memory for size, tenderness, or location. Write down where you are in your menstrual cycle, if you are still menstruating. °If you are having trouble noticing differences   in your breasts, do not get discouraged. With time you will become more familiar with the variations in your breasts and more comfortable with the exam. How often should I examine my breasts? Examine your breasts every month. If you are breastfeeding, the best time to examine your breasts is after a feeding or after using a breast pump. If you menstruate, the best time to examine your breasts is 5-7 days after your period is over. During your period, your breasts are lumpier, and it may be more difficult to notice changes. When should I see my health care provider? See your health care provider if you notice:  A change in shape or size of your breasts or nipples.  A change in the skin of your breast or nipples, such as a reddened or scaly area.  Unusual discharge from your nipples.  A lump or thick area that was not there before.  Pain in your breasts.  Anything that concerns you.  Pelvic Organ Prolapse Pelvic organ prolapse is the stretching, bulging, or dropping of pelvic organs into an abnormal position. It happens when the muscles and tissues that surround and support pelvic structures become  weak or stretched. Pelvic organ prolapse can involve the:  Vagina (vaginal prolapse).  Uterus (uterine prolapse).  Bladder (cystocele).  Rectum (rectocele).  Intestines (enterocele). When organs other than the vagina are involved, they often bulge into the vagina or protrude from the vagina, depending on how severe the prolapse is. What are the causes? This condition may be caused by:  Pregnancy, labor, and childbirth.  Past pelvic surgery.  Decreased production of the hormone estrogen associated with menopause.  Consistently lifting more than 50 lb (23 kg).  Obesity.  Long-term inability to pass stool (chronic constipation).  A cough that lasts a long time (chronic).  Buildup of fluid in the abdomen due to certain diseases and other conditions. What are the signs or symptoms? Symptoms of this condition include:  Passing a little urine (loss of bladder control) when you cough, sneeze, strain, and exercise (stress incontinence). This may be worse immediately after childbirth. It may gradually improve over time.  Feeling pressure in your pelvis or vagina. This pressure may increase when you cough or when you are passing stool.  A bulge that protrudes from the opening of your vagina.  Difficulty passing urine or stool.  Pain in your lower back.  Pain, discomfort, or disinterest in sex.  Repeated bladder infections (urinary tract infections).  Difficulty inserting a tampon. In some people, this condition causes no symptoms. How is this diagnosed? This condition may be diagnosed based on a vaginal and rectal exam. During the exam, you may be asked to cough and strain while you are lying down, sitting, and standing up. Your health care provider will determine if other tests are required, such as bladder function tests. How is this treated? Treatment for this condition may depend on your symptoms. Treatment may include:  Lifestyle changes, such as changes to your  diet.  Emptying your bladder at scheduled times (bladder training therapy). This can help reduce or avoid urinary incontinence.  Estrogen. Estrogen may help mild prolapse by increasing the strength and tone of pelvic floor muscles.  Kegel exercises. These may help mild cases of prolapse by strengthening and tightening the muscles of the pelvic floor.  A soft, flexible device that helps support the vaginal walls and keep pelvic organs in place (pessary). This is inserted into your vagina by your health care provider.  Surgery. This is often the only form of treatment for severe prolapse. Follow these instructions at home:  Avoid drinking beverages that contain caffeine or alcohol.  Increase your intake of high-fiber foods. This can help decrease constipation and straining during bowel movements.  Lose weight if recommended by your health care provider.  Wear a sanitary pad or adult diapers if you have urinary incontinence.  Avoid heavy lifting and straining with exercise and work. Do not hold your breath when you perform mild to moderate lifting and exercise activities. Limit your activities as directed by your health care provider.  Do Kegel exercises as directed by your health care provider. To do this: ? Squeeze your pelvic floor muscles tight. You should feel a tight lift in your rectal area and a tightness in your vaginal area. Keep your stomach, buttocks, and legs relaxed. ? Hold the muscles tight for up to 10 seconds. ? Relax your muscles. ? Repeat this exercise 50 times a day, or as many times as told by your health care provider. Continue to do this exercise for at least 4-6 weeks, or for as long as told by your health care provider.  Take over-the-counter and prescription medicines only as told by your health care provider.  If you have a pessary, take care of it as told by your health care provider.  Keep all follow-up visits as told by your health care provider. This is  important. Contact a health care provider if you:  Have symptoms that interfere with your daily activities or sex life.  Need medicine to help with the discomfort.  Notice bleeding from your vagina that is not related to your period.  Have a fever.  Have pain or bleeding when you urinate.  Have bleeding when you pass stool.  Pass urine when you have sex.  Have chronic constipation.  Have a pessary that falls out.  Have bad smelling vaginal discharge.  Have an unusual, low pain in your abdomen. Summary  Pelvic organ prolapse is the stretching, bulging, or dropping of pelvic organs into an abnormal position. It happens when the muscles and tissues that surround and support pelvic structures become weak or stretched.  When organs other than the vagina are involved, they often bulge into the vagina or protrude from the vagina, depending on how severe the prolapse is.  In most cases, this condition needs to be treated only if it produces symptoms. Treatment may include lifestyle changes, estrogen, Kegel exercises, pessary insertion, or surgery.  Avoid heavy lifting and straining with exercise and work. Do not hold your breath when you perform mild to moderate lifting and exercise activities. Limit your activities as directed by your health care provider. This information is not intended to replace advice given to you by your health care provider. Make sure you discuss any questions you have with your health care provider. Document Released: 02/15/2014 Document Revised: 08/12/2017 Document Reviewed: 08/12/2017 Elsevier Patient Education  2020 [employer].

## 2019-06-02 NOTE — Progress Notes (Signed)
43 y.o. G12P1001 Married Black or [place] American Not Hispanic or Latino female here for annual exam.  No dyspareunia. No contraception  Her weight fluctuates. She walks all day at work. Only drinks water.   Cycles have been irregular for the last 3 years. Cycles are ~q28-33 days.  Period Duration (Days): 5 days Period Pattern: (!) Irregular Menstrual Flow: Moderate Menstrual Control: Thin pad Menstrual Control Change Freq (Hours): changes pad every 3 hours Dysmenorrhea: None  Patient's last menstrual period was 05/25/2019 (exact date).          Sexually active: Yes.    The current method of family planning is none.    Exercising: Yes.    walking Smoker:  no  Health Maintenance: Pap:  Unsure History of abnormal Pap:  no MMG:  Never, today BMD:   Never Colonoscopy: Never TDaP:  05/23/2019 Gardasil: Unsure   reports that she has never smoked. She has never used smokeless tobacco. She reports current alcohol use. She reports that she does not use drugs. Occasional ETOH. 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 Daughter is 66, she is in [place]. No grandchildren. Doreene Adas has a son.  Moved here 3 years ago to be with her boyfriend, now husband. Her family is in [place].   Past Medical History:  Diagnosis Date  . Anemia     Past Surgical History:  Procedure Laterality Date  . FOOT SURGERY      Current Outpatient Medications  Medication Sig Dispense Refill  . Vitamin D, Ergocalciferol, (DRISDOL) 50000 units CAPS capsule Take 1 capsule (50,000 Units total) by mouth every 7 (seven) days. 24 capsule 0   No current facility-administered medications for this visit.     Family History  Problem Relation Age of Onset  . Diabetes Mother   . Hyperlipidemia Mother   . Hypertension Mother   . Diabetes Father   . Stroke Father   . Healthy Sister   . Diabetes Brother   . Stroke Brother   . Seizures Brother   . Healthy Brother   . Diabetes Maternal Grandmother     Review  of Systems  Constitutional: Negative.   HENT: Negative.   Eyes: Negative.   Respiratory: Negative.   Cardiovascular: Negative.   Gastrointestinal: Negative.   Endocrine: Negative.   Genitourinary: Positive for menstrual problem.  Musculoskeletal: Negative.   Skin: Negative.   Allergic/Immunologic: Negative.   Neurological: Negative.   Hematological: Negative.   Psychiatric/Behavioral: Negative.     Exam:   BP 130/80 (BP Location: Right Arm, Patient Position: Sitting, Cuff Size: Large)   Pulse 64   Temp (!) 97.1 F (36.2 C) (Skin)   Ht 5\' 8"  (1.727 m)   Wt 271 lb 3.2 oz (123 kg)   LMP 05/25/2019 (Exact Date)   BMI 41.24 kg/m   Weight change: @WEIGHTCHANGE @ Height:   Height: 5\' 8"  (172.7 cm)  Ht Readings from Last 3 Encounters:  06/02/19 5\' 8"  (1.727 m)  05/23/19 5' 7.5" (1.715 m)  10/01/16 5' 7.25" (1.708 m)    General appearance: alert, cooperative and appears stated age Head: Normocephalic, without obvious abnormality, atraumatic Neck: no adenopathy, supple, symmetrical, trachea midline and thyroid normal to inspection and palpation Lungs: clear to auscultation bilaterally Cardiovascular: regular rate and rhythm Breasts: normal appearance, no masses or tenderness Abdomen: soft, non-tender; non distended,  no masses,  no organomegaly Extremities: extremities normal, atraumatic, no cyanosis or edema Skin: Skin color, texture, turgor normal. No rashes or lesions  Lymph nodes: Cervical, supraclavicular, and axillary nodes normal. No abnormal inguinal nodes palpated Neurologic: Grossly normal   Pelvic: External genitalia:  no lesions              Urethra:  normal appearing urethra with no masses, tenderness or lesions              Bartholins and Skenes: normal                 Vagina: normal appearing vagina with normal color and discharge, no lesions              Cervix: no lesions   Grade 2 uterine prolapse. The cervix feels elongated, protrudes 1 cm out of the  introitus with valsalva. Mild cystocele, no significant rectocele.               Bimanual Exam:  Uterus:  no masses or tenderness, exam limited by BMI              Adnexa: no mass, fullness, tenderness               Rectovaginal: Confirms               Anus:  normal sphincter tone, no lesions  Chaperone was present for exam.  A:  Well Woman with normal exam  Uterine prolapse, patient isn't bothered by it, no urinary leakage or concerns  BMI 41, declines referral to medical weight loss clinic  Declines contraception, advised to use PNV  Slight cycle irregularity, recent normal TSH. Not concerning  P:   Pap with hpv  Discussed breast self exam  Discussed calcium and vit D intake  Mammogram today  Discussed prolapse, information given. If bothering her she could try a pessary, surgery is also an option  Labs with primary

## 2019-06-03 ENCOUNTER — Other Ambulatory Visit: Payer: Self-pay | Admitting: Adult Health

## 2019-06-03 DIAGNOSIS — R928 Other abnormal and inconclusive findings on diagnostic imaging of breast: Secondary | ICD-10-CM

## 2019-06-08 ENCOUNTER — Other Ambulatory Visit: Payer: Self-pay | Admitting: Obstetrics and Gynecology

## 2019-06-08 ENCOUNTER — Other Ambulatory Visit: Payer: Self-pay | Admitting: *Deleted

## 2019-06-08 DIAGNOSIS — Z113 Encounter for screening for infections with a predominantly sexual mode of transmission: Secondary | ICD-10-CM

## 2019-06-08 MED ORDER — METRONIDAZOLE 500 MG PO TABS: 500.0000 mg | ORAL_TABLET | Freq: Two times a day (BID) | ORAL | 0 refills | Status: DC

## 2019-06-09 ENCOUNTER — Telehealth: Payer: Self-pay | Admitting: Obstetrics and Gynecology

## 2019-06-09 ENCOUNTER — Other Ambulatory Visit: Payer: Managed Care, Other (non HMO)

## 2019-06-09 NOTE — Telephone Encounter (Signed)
Patient calling to speak with nurse about partner therapy prescription.

## 2019-06-09 NOTE — Telephone Encounter (Signed)
Spoke with pt. Pt is wanting EPT for husband. Pt instructed that Rx will be left at front office for pick up. Flagyl 500mg , 4 tabs PO x1.   1 month TOC appt scheduled for 07/07/19 at 1130 with Dr Talbert Nan. Pt will also wait to get STD labs at that appt. Denies earlier appt for labs.  Routing to provider for final review. Patient is agreeable to disposition. Will close encounter.

## 2019-06-10 ENCOUNTER — Telehealth: Payer: Self-pay | Admitting: Adult Health

## 2019-06-10 NOTE — Progress Notes (Signed)
Received Epic notification that pt has not read MyChart message regarding results.     Valetta Fuller has reviewed your recent lab results. Your Vitamin D level is quite low. Therefore, Valetta Fuller is going to re-start Ergocalciferol. However we do not have a pharmacy listed. Please let us know what pharmacy you would like [place] to use.  Youshould also take over the counter Vitamin D3 2,000 IU daily  We will need to re-check level in 4 months  Your metabolic panel and complete blood count were both stable. However, your A1c (3 month average of blood sugars) was 5.6, which is just under pre-diabetes. Please reduce your sugar and carbohydrate intake in your diet and keep walking.  Your HDL (good) cholesterol was 58 mg/dL (normal is greater than 40), total cholesterol was 206 mg/dL (normal is less than 200) and LD (bad cholesterol) was 134 (normal is less than 100). To help reduce your total and LDL cholesterol you need to reduce saturated fat in your diet and again keep up the walking.    Letter mailed to pt.  Charyl Bigger, CMA

## 2019-06-10 NOTE — Telephone Encounter (Signed)
LVM for pt to call to discuss.  T. Nelson, CMA  

## 2019-06-10 NOTE — Telephone Encounter (Signed)
Patient called wishes provider to assist her with weight loss --appetite suppressant requested.  -- Advised pt provider out of office on  Fridays but [place] request to medical asst for review on Monday with [facility]  & one of them will call her back.@ [phone]  -glh

## 2019-06-13 ENCOUNTER — Other Ambulatory Visit: Payer: Self-pay

## 2019-06-13 MED ORDER — VITAMIN D (ERGOCALCIFEROL) 1.25 MG (50000 UNIT) PO CAPS: 50000.0000 [IU] | ORAL_CAPSULE | ORAL | 0 refills | Status: DC

## 2019-06-13 NOTE — Telephone Encounter (Signed)
MyChart message sent to pt.  T. Eiko Mcgowen, CMA 

## 2019-06-14 ENCOUNTER — Other Ambulatory Visit: Payer: Self-pay | Admitting: Adult Health

## 2019-06-14 ENCOUNTER — Other Ambulatory Visit: Payer: Self-pay

## 2019-06-14 ENCOUNTER — Ambulatory Visit
Admission: RE | Admit: 2019-06-14 | Discharge: 2019-06-14 | Disposition: A | Discharge status: Home / Self Care | Payer: Managed Care, Other (non HMO) | Source: Ambulatory Visit | Attending: Adult Health | Admitting: Adult Health

## 2019-06-14 ENCOUNTER — Ambulatory Visit (INDEPENDENT_AMBULATORY_CARE_PROVIDER_SITE_OTHER): Payer: Managed Care, Other (non HMO) | Admitting: Neurology

## 2019-06-14 ENCOUNTER — Encounter: Payer: Self-pay | Admitting: Neurology

## 2019-06-14 VITALS — BP 104/70 | HR 69 | Temp 97.6°F | Ht 67.5 in | Wt 276.0 lb

## 2019-06-14 DIAGNOSIS — R928 Other abnormal and inconclusive findings on diagnostic imaging of breast: Secondary | ICD-10-CM

## 2019-06-14 DIAGNOSIS — N6310 Unspecified lump in the right breast, unspecified quadrant: Secondary | ICD-10-CM

## 2019-06-14 DIAGNOSIS — Z72821 Inadequate sleep hygiene: Secondary | ICD-10-CM

## 2019-06-14 DIAGNOSIS — R0683 Snoring: Secondary | ICD-10-CM

## 2019-06-14 DIAGNOSIS — Z72820 Sleep deprivation: Secondary | ICD-10-CM | POA: Diagnosis not present

## 2019-06-14 DIAGNOSIS — G478 Other sleep disorders: Secondary | ICD-10-CM | POA: Insufficient documentation

## 2019-06-14 DIAGNOSIS — Z6841 Body Mass Index (BMI) 40.0 and over, adult: Secondary | ICD-10-CM | POA: Diagnosis not present

## 2019-06-14 DIAGNOSIS — G4719 Other hypersomnia: Secondary | ICD-10-CM | POA: Insufficient documentation

## 2019-06-14 MED ORDER — NEPHRO-VITE RX 1 MG PO TABS: 1.0000 | ORAL_TABLET | Freq: Every day | ORAL | 1 refills | Status: DC

## 2019-06-14 NOTE — Patient Instructions (Signed)
Please remember to try to maintain good sleep hygiene, which means: Keep a regular sleep and wake schedule, try not to exercise or have a meal within 2 hours of your bedtime, try to keep your bedroom conducive for sleep, that is, cool and dark, without light distractors such as an illuminated alarm clock, and refrain from watching TV right before sleep or in the middle of the night and do not keep the TV or radio on during the night. Also, try not to use or play on electronic devices at bedtime, such as your cell phone, tablet PC or laptop. If you like to read at bedtime on an electronic device, try to dim the background light as much as possible. Do not eat in the middle of the night.   We will request a sleep study.  We will look for leg twitching and snoring or sleep apnea.   For chronic insomnia, you are best followed by a psychiatrist and/or sleep psychologist.  Insomnia is a sleep disorder that makes it difficult to fall asleep or stay asleep. Insomnia can cause fatigue, low energy, difficulty concentrating, mood swings, and poor performance at work or school. There are three different ways to classify insomnia:  Difficulty falling asleep.  Difficulty staying asleep.  Waking up too early in the morning. Any type of insomnia can be long-term (chronic) or short-term (acute). Both are common. Short-term insomnia usually lasts for three months or less. Chronic insomnia occurs at least three times a week for longer than three months. What are the causes? Insomnia may be caused by another condition, situation, or substance, such as:  Anxiety.  Certain medicines.  Gastroesophageal reflux disease (GERD) or other gastrointestinal conditions.  Asthma or other breathing conditions.  Restless legs syndrome, sleep apnea, or other sleep disorders.  Menopause.  Stroke.  Abuse of alcohol, tobacco, or illegal drugs.  Mental health conditions, such as depression.  Caffeine.  An overactive  thyroid (hyperthyroidism). Sometimes, the cause of insomnia may not be known. What increases the risk? Risk factors for insomnia include:  Gender. Women are affected more often than men.  Age. Insomnia is more common as you get older.  Stress.  Lack of exercise.  Irregular work schedule or working night shifts.  Traveling between different time zones.  Certain medical and mental health conditions. What are the signs or symptoms? If you have insomnia, the main symptom is having trouble falling asleep or having trouble staying asleep. This may lead to other symptoms, such as:  Feeling fatigued or having low energy.  Feeling nervous about going to sleep.  Not feeling rested in the morning.  Having trouble concentrating.  Feeling irritable, anxious, or depressed. How is this diagnosed? This condition may be diagnosed based on:  Your symptoms and medical history. Your health care provider may ask about: ? Your sleep habits. ? Any medical conditions you have. ? Your mental health.  A physical exam. How is this treated? Treatment for insomnia depends on the cause. Treatment may focus on treating an underlying condition that is causing insomnia. Treatment may also include:  Medicines to help you sleep.  Counseling or therapy.  Lifestyle adjustments to help you sleep better. Follow these instructions at home: Eating and drinking   Limit or avoid alcohol, caffeinated beverages, and cigarettes, especially close to bedtime. These can disrupt your sleep.  Do not eat a large meal or eat spicy foods right before bedtime. This can lead to digestive discomfort that can make it hard for  you to sleep. Sleep habits   Keep a sleep diary to help you and your health care provider figure out what could be causing your insomnia. Write down: ? When you sleep. ? When you wake up during the night. ? How well you sleep. ? How rested you feel the next day. ? Any side effects of  medicines you are taking. ? What you eat and drink.  Make your bedroom a dark, comfortable place where it is easy to fall asleep. ? Put up shades or blackout curtains to block light from outside. ? Use a white noise machine to block noise. ? Keep the temperature cool.  Limit screen use before bedtime. This includes: ? Watching TV. ? Using your smartphone, tablet, or computer.  Stick to a routine that includes going to bed and waking up at the same times every day and night. This can help you fall asleep faster. Consider making a quiet activity, such as reading, part of your nighttime routine.  Try to avoid taking naps during the day so that you sleep better at night.  Get out of bed if you are still awake after 15 minutes of trying to sleep. Keep the lights down, but try reading or doing a quiet activity. When you feel sleepy, go back to bed. General instructions  Take over-the-counter and prescription medicines only as told by your health care provider.  Exercise regularly, as told by your health care provider. Avoid exercise starting several hours before bedtime.  Use relaxation techniques to manage stress. Ask your health care provider to suggest some techniques that may work well for you. These may include: ? Breathing exercises. ? Routines to release muscle tension. ? Visualizing peaceful scenes.  Make sure that you drive carefully. Avoid driving if you feel very sleepy.  Keep all follow-up visits as told by your health care provider. This is important. Contact a health care provider if:  You are tired throughout the day.  You have trouble in your daily routine due to sleepiness.  You continue to have sleep problems, or your sleep problems get worse. Get help right away if:  You have serious thoughts about hurting yourself or someone else. If you ever feel like you may hurt yourself or others, or have thoughts about taking your own life, get help right away. You can go  to your nearest emergency department or call:  Your local emergency services (911 in the [place]  A suicide crisis helpline, such as the [facility] at 7014063070. This is open 24 hours a day. Summary  Insomnia is a sleep disorder that makes it difficult to fall asleep or stay asleep.  Insomnia can be long-term (chronic) or short-term (acute).  Treatment for insomnia depends on the cause. Treatment may focus on treating an underlying condition that is causing insomnia.  Keep a sleep diary to help you and your health care provider figure out what could be causing your insomnia. This information is not intended to replace advice given to you by your health care provider. Make sure you discuss any questions you have with your health care provider. Document Released: 07/18/2000 Document Revised: 07/03/2017 Document Reviewed: 04/30/2017 Elsevier Patient Education  [place]. Fatigue If you have fatigue, you feel tired all the time and have a lack of energy or a lack of motivation. Fatigue may make it difficult to start or complete tasks because of exhaustion. In general, occasional or mild fatigue is often a normal response to activity  or life. However, long-lasting (chronic) or extreme fatigue may be a symptom of a medical condition. Follow these instructions at home: General instructions  Watch your fatigue for any changes.  Go to bed and get up at the same time every day.  Avoid fatigue by pacing yourself during the day and getting enough sleep at night.  Maintain a healthy weight. Medicines  Take over-the-counter and prescription medicines only as told by your health care provider.  Take a multivitamin, if told by your health care provider.  Do not use herbal or dietary supplements unless they are approved by your health care provider. Activity   Exercise regularly, as told by your health care provider.  Use or practice techniques to help  you relax, such as yoga, tai chi, meditation, or massage therapy. Eating and drinking   Avoid heavy meals in the evening.  Eat a well-balanced diet, which includes lean proteins, whole grains, plenty of fruits and vegetables, and low-fat dairy products.  Avoid consuming too much caffeine.  Avoid the use of alcohol.  Drink enough fluid to keep your urine pale yellow. Lifestyle  Change situations that cause you stress. Try to keep your work and personal schedule in balance.  Do not use any products that contain nicotine or tobacco, such as cigarettes and e-cigarettes. If you need help quitting, ask your health care provider.  Do not use drugs. Contact a health care provider if:  Your fatigue does not get better.  You have a fever.  You suddenly lose or gain weight.  You have headaches.  You have trouble falling asleep or sleeping through the night.  You feel angry, guilty, anxious, or sad.  You are unable to have a bowel movement (constipation).  Your skin is dry.  You have swelling in your legs or another part of your body. Get help right away if:  You feel confused.  Your vision is blurry.  You feel faint or you pass out.  You have a severe headache.  You have severe pain in your abdomen, your back, or the area between your waist and hips (pelvis).  You have chest pain, shortness of breath, or an irregular or fast heartbeat.  You are unable to urinate, or you urinate less than normal.  You have abnormal bleeding, such as bleeding from the rectum, vagina, nose, lungs, or nipples.  You vomit blood.  You have thoughts about hurting yourself or others. If you ever feel like you may hurt yourself or others, or have thoughts about taking your own life, get help right away. You can go to your nearest emergency department or call:  Your local emergency services (911 in the [place]  A suicide crisis helpline, such as the [facility] at  360-429-2338. This is open 24 hours a day. Summary  If you have fatigue, you feel tired all the time and have a lack of energy or a lack of motivation.  Fatigue may make it difficult to start or complete tasks because of exhaustion.  Long-lasting (chronic) or extreme fatigue may be a symptom of a medical condition.  Exercise regularly, as told by your health care provider.  Change situations that cause you stress. Try to keep your work and personal schedule in balance. This information is not intended to replace advice given to you by your health care provider. Make sure you discuss any questions you have with your health care provider. Document Released: 05/18/2007 Document Revised: 11/11/2018 Document Reviewed: 04/15/2017 Elsevier Patient Education  [place].

## 2019-06-14 NOTE — Progress Notes (Signed)
SLEEP MEDICINE CLINIC    Provider:  Larey Seat, MD  Primary Care Physician:  Esaw Grandchild, NP [place] [address]     Referring Provider: Esaw Grandchild, [place],  [place] [address]          Chief Complaint according to patient   Patient presents with:    . New Patient (Initial Visit)           HISTORY OF PRESENT ILLNESS:  Kathleen Ramos is a 43 y.o. year old  [place] American female patient seen upon NP referral on 06/14/2019  Chief concern according to patient : " I am fatigued all the time and my husband stated I snore, I am tired" .   I have the pleasure of seeing Kathleen Ramos today, a right -handed African American female with a possible sleep disorder.  She has a  has a past medical history of Anemia. , bone spurs at the ankle, significant weight gain over 3 years partially thought to be related to limited exercise tolerance. Her job keeps her on her feet.    Sleep relevant medical history: no nocturia, no pain, no choking. Sleep fragmentation is unexplained.  Family medical /sleep history: no other family member on CPAP with OSA.    Social history: Patient is working in patient nutrition- at [employer] , and lives in a household with 2 persons. Family status is married with an adult daughter.  The patient currently works in the normal daytime . Pets are not present. Tobacco use: never .  ETOH use - occasionally ,  Caffeine intake in form of Coffee( none) Soda( none) Tea ( green tea , but only 2 / week ) or energy drinks. Regular exercise in form of walking .    Hobbies :none.    Sleep habits are as follows: The patient's dinner time is between 7.30-8.30  PM. The patient goes to bed at variable times- latest after midnight , after she has fallen asleep on the sofa, watching TV-  PM and continues to sleep for 5 hours, no bathroom breaks.   The preferred sleep position is laterally, with the support of  2 pillows.  Dreams are reportedly frequent/vivid. No history of sleep walking.  5.30  AM is the usual rise time. The patient wakes up with an alarm.  She reports not feeling refreshed or restored in AM, without  symptoms of dry mouth, of morning headaches, just residual fatigue.  Naps are takenfrequently, unplanned, and lasting from 1 to 2 hours and are more refreshing than nocturnal sleep.      Review of Systems: Out of a complete 14 system review, the patient complains of only the following symptoms, and all other reviewed systems are negative.:  Fatigue, sleepiness , snoring, not so much fragmented sleep, sleep restriction-    How likely are you to doze in the following situations: 0 = not likely, 1 = slight chance, 2 = moderate chance, 3 = high chance   Sitting and Reading? 3 Watching Television? 3 Sitting inactive in a public place (theater or meeting)?0 As a passenger in a car for an hour without a break? 3 Lying down in the afternoon when circumstances permit?3 Sitting and talking to someone?3 Sitting quietly after lunch without alcohol?1 In a car, while stopped for a few minutes in traffic?0   Total = 15/ 24 points   FSS endorsed at 28 / 63 points.  Social History   Socioeconomic History  . Marital status: Married    Spouse name: Not on file  . Number of children: Not on file  . Years of education: Not on file  . Highest education level: Not on file  Occupational History  . Not on file  Social Needs  . Financial resource strain: Not on file  . Food insecurity    Worry: Not on file    Inability: Not on file  . Transportation needs    Medical: Not on file    Non-medical: Not on file  Tobacco Use  . Smoking status: Never Smoker  . Smokeless tobacco: Never Used  Substance and Sexual Activity  . Alcohol use: Yes    Comment: occassionally  . Drug use: Never  . Sexual activity: Yes    Birth control/protection: None  Lifestyle  . Physical activity    Days per  week: Not on file    Minutes per session: Not on file  . Stress: Not on file  Relationships  . Social Musician on phone: Not on file    Gets together: Not on file    Attends religious service: Not on file    Active member of club or organization: Not on file    Attends meetings of clubs or organizations: Not on file    Relationship status: Not on file  Other Topics Concern  . Not on file  Social History Narrative   ** Merged History Encounter **        Family History  Problem Relation Age of Onset  . Diabetes Mother   . Hyperlipidemia Mother   . Hypertension Mother   . Diabetes Father   . Stroke Father   . Healthy Sister   . Diabetes Brother   . Stroke Brother   . Seizures Brother   . Healthy Brother   . Diabetes Maternal Grandmother     Past Medical History:  Diagnosis Date  . Anemia   Notes recorded by Kathleen Fusi, NP on 05/24/2019 at 9:36 AM EDT  Good Afternoon Kathleen Ramos,  Can you please call Kathleen Ramos and share-  Vit Ramos- quite low- will re-start Ergocalciferol- however she does not have a pharmacy listed- please ask her what pharmacy she would like [place] to use.  She also should take OTC Vit Ramos 2,000 IU daily  Will re-check level in 4 months  CMP-stable  CBC-stable  A1c-5.6-just under pre-diabetes, please encourage her to reduce CHO/sugar and keep walking.  The 10-year ASCVD risk score Denman George DC Montez Hageman., et al., 2013) is: 0.7%   Past Surgical History:  Procedure Laterality Date  . FOOT SURGERY       Current Outpatient Medications on File Prior to Visit  Medication Sig Dispense Refill  . metroNIDAZOLE (FLAGYL) 500 MG tablet Take 1 tablet (500 mg total) by mouth 2 (two) times daily. 14 tablet 0  . Vitamin Ramos, Ergocalciferol, (DRISDOL) 1.25 MG (50000 UT) CAPS capsule Take 1 capsule (50,000 Units total) by mouth every 7 (seven) days. 12 capsule 0   No current facility-administered medications on file prior to visit.      Physical exam:  Today's Vitals    06/14/19 1000  BP: 104/70  Pulse: 69  Temp: 97.6 F (36.4 C)  Weight: 276 lb (125.2 kg)  Height: 5' 7.5" (1.715 m)   Body mass index is 42.59 kg/m.   Wt Readings from Last 3 Encounters:  06/14/19 276 lb (125.2 kg)  06/02/19 271 lb 3.2 oz (123 kg)  05/23/19 267 lb 3.2 oz (121.2 kg)     Ht Readings from Last 3 Encounters:  06/14/19 5' 7.5" (1.715 m)  06/02/19 5\' 8"  (1.727 m)  05/23/19 5' 7.5" (1.715 m)      General: The patient is awake, alert and appears not in acute distress. The patient is well groomed. Head: Normocephalic, atraumatic. Neck is supple. Mallampati  2 . Lateral narrowing. ,  neck circumference:17.25  inches . Nasal airflow patent.   Retrognathia is  seen.  Dental status: intact Cardiovascular:  Regular rate and cardiac rhythm by pulse,  without distended neck veins. Respiratory: Lungs are clear to auscultation.  Skin:  Bilateral  ankle edema, no rash. Trunk: The patient's posture is erect.   Neurologic exam : The patient is awake and alert, oriented to place and time.   Memory subjective described as intact.  Attention span & concentration ability appears normal.  Speech is fluent,  without  dysarthria, dysphonia or aphasia.  Mood and affect are appropriate.   Cranial nerves: no loss of smell or taste reported.  Pupils are equal and briskly reactive to light. Funduscopic exam deferred. .  Extraocular movements in vertical and horizontal planes were intact and without nystagmus. No Diplopia. Visual fields by finger perimetry are intact. Hearing was intact to soft voice and finger rubbing.   Facial sensation intact to fine touch.  Facial motor strength is symmetric and tongue and uvula move midline.  Neck ROM : rotation, tilt and flexion extension were normal for age and shoulder shrug was symmetrical.    Motor exam:  Symmetric bulk, tone and ROM.   Normal tone without cog wheeling, symmetric grip strength .   Sensory:  Fine touch, pinprick and  vibration were tested  and  normal.  Proprioception tested in the upper extremities was normal.   Coordination: Rapid alternating movements in the fingers/hands were of normal speed.  The Finger-to-nose maneuver was intact without evidence of ataxia, dysmetria or tremor.   Gait and station: Patient could rise unassisted from a seated position, walked without assistive device.  Stance is of normal width/ base and the patient turned with 3 steps.  Toe and heel walk were deferred.  Deep tendon reflexes: in the upper and lower extremities are symmetrically attenuated- hyporeflexic. Babinski response was deferred.        After spending a total time of  35  minutes face to face and additional time for physical and neurologic examination, review of laboratory studies,  personal review of imaging studies, reports and results of other testing and review of referral information / records as far as provided in visit, I have established the following assessments:   1)  Husband reports snoring and patient reports non restorative sleep with high degree of daytime sleepiness.   Patient sleeps better in bed than in a recliner.  2) limited sleep time, cumulative sleep deprivation. Bedroom with TV running.  Sleep hygiene is poor. She has not tried an audio book or just reading  3)  Weight is a risk factor, neck size and BMI- OSA most likley.   My Plan is to proceed with:  1) Only  HST covered by [place]. 2) Sleep hygiene rules- routines to be established.  3) Advance bedtime to before midnight, and keep a diary/ sleep journal.   I would like to thank Kathleen Fusianford, Kathleen D, NP, [address] [place] [id number] for allowing me to meet with and  to take care of this pleasant patient.   In short, Kathleen Ramos is presenting with EDS and snoring, but also poor sleep habits. .   I plan to follow up either personally or through our NP within 2-3  month.   CC: I will share my notes with PCP.    Electronically signed by: Melvyn Novas, MD 06/14/2019 10:37 AM  [facility] Board certified by The [employer] of Sleep Medicine and Diplomate of the [employer] of Sleep Medicine. Board certified In Neurology through the ABPN, Fellow of the [employer] of Neurology. Medical Director of [facility].

## 2019-06-20 ENCOUNTER — Ambulatory Visit
Admission: RE | Admit: 2019-06-20 | Discharge: 2019-06-20 | Disposition: A | Discharge status: Home / Self Care | Payer: Managed Care, Other (non HMO) | Source: Ambulatory Visit | Attending: Adult Health | Admitting: Adult Health

## 2019-06-20 ENCOUNTER — Other Ambulatory Visit: Payer: Self-pay

## 2019-06-20 DIAGNOSIS — N6310 Unspecified lump in the right breast, unspecified quadrant: Secondary | ICD-10-CM

## 2019-06-20 LAB — CYTOLOGY - PAP
Chlamydia: NEGATIVE
Comment: NEGATIVE
Comment: NEGATIVE
Comment: NORMAL
Diagnosis: NEGATIVE
High risk HPV: NEGATIVE
Neisseria Gonorrhea: NEGATIVE

## 2019-06-28 ENCOUNTER — Ambulatory Visit (INDEPENDENT_AMBULATORY_CARE_PROVIDER_SITE_OTHER): Payer: Managed Care, Other (non HMO) | Admitting: Neurology

## 2019-06-28 DIAGNOSIS — Z6841 Body Mass Index (BMI) 40.0 and over, adult: Secondary | ICD-10-CM

## 2019-06-28 DIAGNOSIS — G4733 Obstructive sleep apnea (adult) (pediatric): Secondary | ICD-10-CM

## 2019-06-28 DIAGNOSIS — Z72821 Inadequate sleep hygiene: Secondary | ICD-10-CM

## 2019-06-28 DIAGNOSIS — G478 Other sleep disorders: Secondary | ICD-10-CM

## 2019-06-28 DIAGNOSIS — Z72820 Sleep deprivation: Secondary | ICD-10-CM

## 2019-06-28 DIAGNOSIS — R0683 Snoring: Secondary | ICD-10-CM

## 2019-06-28 DIAGNOSIS — G4719 Other hypersomnia: Secondary | ICD-10-CM

## 2019-07-05 ENCOUNTER — Telehealth: Payer: Self-pay | Admitting: *Deleted

## 2019-07-05 NOTE — Telephone Encounter (Signed)
Spoke with patient, notified of results as seen below per Dr. Talbert Nan. Patient verbalizes understanding and is agreeable.   Encounter closed.

## 2019-07-05 NOTE — Telephone Encounter (Signed)
Notes recorded by Burnice Logan, RN on 07/05/2019 at 9:37 AM EST  MyChart message has not been read.  Call to patient, Left message to call Sharee Pimple, RN at [facility].   ------   Notes recorded by Salvadore Dom, MD on 06/28/2019 at 4:57 PM EST  Results and any recommendations were sent via [facility].    Hi Mina,  The Gonorrhea and Chlamydia testing were negative.  Have a great Thanksgiving!  Sumner Boast  ------

## 2019-07-05 NOTE — Telephone Encounter (Signed)
Patient returning call to Jill. °

## 2019-07-06 ENCOUNTER — Other Ambulatory Visit: Payer: Self-pay

## 2019-07-07 ENCOUNTER — Ambulatory Visit (INDEPENDENT_AMBULATORY_CARE_PROVIDER_SITE_OTHER): Payer: Managed Care, Other (non HMO) | Admitting: Obstetrics and Gynecology

## 2019-07-07 ENCOUNTER — Other Ambulatory Visit: Payer: Self-pay

## 2019-07-07 ENCOUNTER — Encounter: Payer: Self-pay | Admitting: Obstetrics and Gynecology

## 2019-07-07 VITALS — BP 136/78 | HR 88 | Temp 97.3°F | Wt 277.2 lb

## 2019-07-07 DIAGNOSIS — Z113 Encounter for screening for infections with a predominantly sexual mode of transmission: Secondary | ICD-10-CM

## 2019-07-07 DIAGNOSIS — Z8619 Personal history of other infectious and parasitic diseases: Secondary | ICD-10-CM | POA: Diagnosis not present

## 2019-07-07 NOTE — Progress Notes (Signed)
GYNECOLOGY  VISIT   HPI: 43 y.o.   Married Black or [place] American Not Hispanic or Latino  female   [id number] with Patient's last menstrual period was 06/06/2019 (exact date).   here for Trichomonas retesting and lab work for STDs.  She doesn't know if her husband cheated. They have both been treated. She hasn't had prior STD testing here. No symptoms or c/o.   GYNECOLOGIC HISTORY: Patient's last menstrual period was 06/06/2019 (exact date). Contraception:None Menopausal hormone therapy: None        OB History    Gravida  1   Para  1   Term  1   Preterm      AB      Living  1     SAB      TAB      Ectopic      Multiple      Live Births  1              Patient Active Problem List   Diagnosis Date Noted  . 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 Excessive daytime sleepiness 06/14/2019  . Inadequate sleep hygiene 06/14/2019  . Sleep deprivation 06/14/2019  . Healthcare maintenance 05/23/2019  . BMI 40.0-44.9, adult ([facility]) 05/23/2019  . Snoring 05/23/2019  . Family history of diabetes mellitus in first degree relative 10/01/2016  . Other fatigue 10/01/2016  . Routine physical examination 10/01/2016  . Morbid obesity ([facility]) 10/01/2016  . Right ankle pain 10/01/2016    Past Medical History:  Diagnosis Date  . Anemia     Past Surgical History:  Procedure Laterality Date  . FOOT SURGERY      Current Outpatient Medications  Medication Sig Dispense Refill  . B Complex-C-Folic Acid (B COMPLEX-VITAMIN C-FOLIC ACID) 1 MG tablet Take 1 tablet by mouth daily with breakfast. 90 tablet 1  . Vitamin D, Ergocalciferol, (DRISDOL) 1.25 MG (50000 UT) CAPS capsule Take 1 capsule (50,000 Units total) by mouth every 7 (seven) days. 12 capsule 0   No current facility-administered medications for this visit.      ALLERGIES: Patient has no known allergies.  Family History  Problem Relation Age of Onset  . Diabetes Mother   . Hyperlipidemia Mother   . Hypertension Mother    . Diabetes Father   . Stroke Father   . Healthy Sister   . Diabetes Brother   . Stroke Brother   . Seizures Brother   . Healthy Brother   . Diabetes Maternal Grandmother     Social History   Socioeconomic History  . Marital status: Married    Spouse name: Not on file  . Number of children: Not on file  . Years of education: Not on file  . Highest education level: Not on file  Occupational History  . Not on file  Social Needs  . Financial resource strain: Not on file  . Food insecurity    Worry: Not on file    Inability: Not on file  . Transportation needs    Medical: Not on file    Non-medical: Not on file  Tobacco Use  . Smoking status: Never Smoker  . Smokeless tobacco: Never Used  Substance and Sexual Activity  . Alcohol use: Yes    Comment: occassionally  . Drug use: Never  . Sexual activity: Yes    Birth control/protection: None  Lifestyle  . Physical activity    Days per week: Not on file    Minutes per session: Not on  file  . Stress: Not on file  Relationships  . Social Herbalist on phone: Not on file    Gets together: Not on file    Attends religious service: Not on file    Active member of club or organization: Not on file    Attends meetings of clubs or organizations: Not on file    Relationship status: Not on file  . Intimate partner violence    Fear of current or ex partner: Not on file    Emotionally abused: Not on file    Physically abused: Not on file    Forced sexual activity: Not on file  Other Topics Concern  . Not on file  Social History Narrative   ** Merged History Encounter **        Review of Systems  Constitutional: Negative.   HENT: Negative.   Eyes: Negative.   Respiratory: Negative.   Cardiovascular: Negative.   Gastrointestinal: Negative.   Genitourinary: Negative.   Musculoskeletal: Negative.   Skin: Negative.   Neurological: Negative.   Endo/Heme/Allergies: Negative.   Psychiatric/Behavioral:  Negative.     PHYSICAL EXAMINATION:    BP 136/78 (BP Location: Right Arm, Patient Position: Sitting, Cuff Size: Large)   Pulse 88   Temp (!) 97.3 F (36.3 C) (Skin)   Wt 277 lb 3.2 oz (125.7 kg)   LMP 06/06/2019 (Exact Date)   BMI 42.77 kg/m     General appearance: alert, cooperative and appears stated age  Pelvic: External genitalia:  no lesions              Urethra:  normal appearing urethra with no masses, tenderness or lesions              Bartholins and Skenes: normal                 Vagina: normal appearing vagina with normal color and discharge, no lesions              Cervix:  no lesions, at her introitus  Chaperone was present for exam.  ASSESSMENT H/O Trich, s/p treatment here for TOC    PLAN Nuswab for trich Blood work for STD testing Negative GC/CT at the same time as the + trich  Information on Trich given   An After Visit Summary was printed and given to the patient.

## 2019-07-07 NOTE — Patient Instructions (Signed)
Trichomoniasis Trichomoniasis is an STI (sexually transmitted infection) that can affect both women and men. In women, the outer area of the female genitalia (vulva) and the vagina are affected. In men, mainly the penis is affected, but the prostate and other reproductive organs can also be involved.  This condition can be treated with medicine. It often has no symptoms (is asymptomatic), especially in men. If not treated, trichomoniasis can last for months or years. What are the causes? This condition is caused by a parasite called Trichomonas vaginalis. Trichomoniasis most often spreads from person to person (is contagious) through sexual contact. What increases the risk? The following factors may make you more likely to develop this condition:  Having unprotected sex.  Having sex with a partner who has trichomoniasis.  Having multiple sexual partners.  Having had previous trichomoniasis infections or other STIs. What are the signs or symptoms? In women, symptoms of trichomoniasis include:  Abnormal vaginal discharge that is clear, white, gray, or yellow-green and foamy and has an unusual "fishy" odor.  Itching and irritation of the vagina and vulva.  Burning or pain during urination or sex.  Redness and swelling of the genitals. In men, symptoms of trichomoniasis include:  Penile discharge that may be foamy or contain pus.  Pain in the penis. This may happen only when urinating.  Itching or irritation inside the penis.  Burning after urination or ejaculation. How is this diagnosed? In women, this condition may be found during a routine Pap test or physical exam. It may be found in men during a routine physical exam. Your health care provider may do tests to help diagnose this infection, such as:  Urine tests (men and women).  The following in women: ? Testing the pH of the vagina. ? A vaginal swab test that checks for the Trichomonas vaginalis parasite. ? Testing vaginal  secretions. Your health care provider may test you for other STIs, including HIV (human immunodeficiency virus). How is this treated? This condition is treated with medicine taken by mouth (orally), such as metronidazole or tinidazole, to fight the infection. Your sexual partner(s) also need to be tested and treated.  If you are a woman and you plan to become pregnant or think you may be pregnant, tell your health care provider right away. Some medicines that are used to treat the infection should not be taken during pregnancy. Your health care provider may recommend over-the-counter medicines or creams to help relieve itching or irritation. You may be tested for infection again 3 months after treatment. Follow these instructions at home:  Take and use over-the-counter and prescription medicines, including creams, only as told by your health care provider.  Take your antibiotic medicine as told by your health care provider. Do not stop taking the antibiotic even if you start to feel better.  Do not have sex until 7-10 days after you finish your medicine, or until your health care provider approves. Ask your health care provider when you may start to have sex again.  (Women) Do not douche or wear tampons while you have the infection.  Discuss your infection with your sexual partner(s). Make sure that your partner gets tested and treated, if necessary.  Keep all follow-up visits as told by your health care provider. This is important. How is this prevented?   Use condoms every time you have sex. Using condoms correctly and consistently can help protect against STIs.  Avoid having multiple sexual partners.  Talk with your sexual partner about any   symptoms that either of you may have, as well as any history of STIs.  Get tested for STIs and STDs (sexually transmitted diseases) before you have sex. Ask your partner to do the same.  Do not have sexual contact if you have symptoms of  trichomoniasis or another STI. Contact a health care provider if:  You still have symptoms after you finish your medicine.  You develop pain in your abdomen.  You have pain when you urinate.  You have bleeding after sex.  You develop a rash.  You feel nauseous or you vomit.  You plan to become pregnant or think you may be pregnant. Summary  Trichomoniasis is an STI (sexually transmitted infection) that can affect both women and men.  This condition often has no symptoms (is asymptomatic), especially in men.  Without treatment, this condition can last for months or years.  You should not have sex until 7-10 days after you finish your medicine, or until your health care provider approves. Ask your health care provider when you may start to have sex again.  Discuss your infection with your sexual partner(s). Make sure that your partner gets tested and treated, if necessary. This information is not intended to replace advice given to you by your health care provider. Make sure you discuss any questions you have with your health care provider. Document Released: 01/14/2001 Document Revised: 05/04/2018 Document Reviewed: 05/04/2018 Elsevier Patient Education  2020 Elsevier Inc.  

## 2019-07-07 NOTE — Addendum Note (Signed)
Addended by: Dorothy Spark on: 07/07/2019 04:02 PM   Modules accepted: Orders

## 2019-07-08 LAB — HEP, RPR, HIV PANEL
HIV Screen 4th Generation wRfx: NONREACTIVE
Hepatitis B Surface Ag: NEGATIVE
RPR Ser Ql: NONREACTIVE

## 2019-07-08 LAB — TRICHOMONAS VAGINALIS, PROBE AMP: Trich vag by NAA: NEGATIVE

## 2019-07-08 LAB — HEPATITIS C ANTIBODY: Hep C Virus Ab: <0.1 s/co ratio (ref 0.0–0.9)

## 2019-07-11 NOTE — Procedures (Addendum)
Patient Information     First Name: Kathleen Ramos Last Name: Arpin ID: [id number]  Birth Date: 01/15/1976 Age: 43 Gender: Female  Referring  Provider:       William Hamburger, NP BMI: 41.8 (W=275 lb, H=5' 8'')  Neck Circ.:  17 '' Epworth:  15   Sleep Study Information    Study Date: Jun 28, 2019 S/H/A Version: [id number] / 4.1.1528 / 77  History:    Kathleen Ramos is a 43-year -old African-American female patient seen upon NP Danford's referral on 06/14/2019  Chief concern according to patient: " I am fatigued all the time and my husband stated that I snore, I am tired". Excessive daytime sleepiness is present.    Kathleen Ramos has a medical history of Anemia, bone spurs at the ankle, significant weight gain over 3 years, partially thought to be related to limited exercise tolerance. Her job keeps her on her feet.    Sleep relevant medical history: no nocturia, no pain, no choking. Sleep fragmentation is unexplained.  Family medical /sleep history: no other family member on CPAP with OSA.  Summary & Diagnosis:      There was moderately-severe sleep apnea noted at AHI 21.5/h and in REM sleep AHI exacerbated to 46.8/h- severe. There was no clinically significant hypoxemia. A trend to bradycardia was noted. Sleep became less fragmented after midnight.  There was also more apnea while sleeping on her right side.     Recommendations:      CPAP intervention is recommended with an autotitration device of 6-16 cm water, heated humidity and mask of patient's choice. Avoiding to sleep on the right side may also help, as will weight loss.  Not a candidate for dental device based on REM dependent apnea.  Interpreting Physician: Melanee Left             Sleep Summary  Oxygen Saturation Statistics   Start Study Time: End Study Time: Total Recording Time:  7:40:47 PM 4:15:30 AM 8 h,  Total Sleep Time % REM of Sleep Time:  6 h, 15 min 13.8    Mean: 96 Minimum: 88 Maximum:  100  Mean of Desaturations Nadirs (%):   94  Oxygen Desat. %:   4-9 10-20 >20 Total  Events Number Total    30  2 93.8 6.3  0 0.0  32 100.0  Oxygen Saturation: <90 <=88 <85 <80 <70  Duration (minutes): Sleep % 0.0 0.0  0.0 0.0  0.0 0.0 0.0 0.0 0.0 0.0     Respiratory Indices      Total Events REM NREM All Night  pRDI:  136  pAHI:  117 ODI:  32  pAHIc:  1 %  CSR: 0.0 46.8 46.8 10.6 0.0 22.0 18.0 5.2 0.3 25.0 21.5 5.9 0.2       Pulse Rate Statistics during Sleep (BPM)      Mean: 68 Minimum: 35 Maximum: 100    Indices are calculated using technically valid sleep time of  5 h, 25 min. Central-Indices are calculated using technically valid sleep time of  4  h, 8 min. pRDI/pAHI are calculated using 02 desaturations ? 3% Sit N/A Body Position Statistics  Position Supine Prone Right Left Non-Supine  Sleep (min) 0.0 216.7 104.5 54.0 375.2  Sleep % 0.0 57.8 27.9 14.4 100.0  pRDI N/A 22.1 32.3 20.2 25.0  pAHI N/A 18.3 29.9 15.2 21.5  ODI N/A 3.5 9.6 8.4 5.9     Snoring Statistics Snoring  Level (dB) >40 >50 >60 >70 >80 >Threshold (45)  Sleep (min) 246.1 18.0 6.5 0.0 0.0 29.3  Sleep % 65.6 4.8 1.7 0.0 0.0 7.8    Mean: 42 dB Sleep Stages Chart                                 pAHI=21.5                                             Mild              Moderate                    Severe  Patient Information     First Name: Kathleen Ramos Last Name: [place] ID: [id number]  Birth Date: 03-11-1976 Age: 75 Gender: Female  Referring  Provider:       Mina Marble, NP BMI: 41.8 (W=275 lb, H=5' 8'')  Neck Circ.:  17 '' Epworth:  15   Sleep Study Information    Study Date: Jun 28, 2019 S/H/A Version: [id number] / 4.1.1528 / 77  History:    Kathleen Ramos is a 21-year -old African-American female patient seen upon NP Danford's referral on 06/14/2019  Chief concern according to patient: " I am fatigued all the time and my husband stated that I snore, I am tired". Excessive  daytime sleepiness is present.    Kathleen Ramos has a medical history of Anemia, bone spurs at the ankle, significant weight gain over 3 years, partially thought to be related to limited exercise tolerance. Her job keeps her on her feet.    Sleep relevant medical history: no nocturia, no pain, no choking. Sleep fragmentation is unexplained.  Family medical /sleep history: no other family member on CPAP with OSA.  Summary & Diagnosis:      There was moderately-severe sleep apnea noted at AHI 21.5/h and in REM sleep AHI exacerbated to 46.8/h- severe. There was no clinically significant hypoxemia. A trend to bradycardia was noted. Sleep became less fragmented after midnight.  There was also more apnea while sleeping on her right side.     Recommendations:      CPAP intervention is recommended with an autotitration device of 6-16 cm water, heated humidity and mask of patient's choice. Avoiding to sleep on the right side may also help, as will weight loss.  Not a candidate for dental device based on REM dependent apnea.  Interpreting Physician: Mack Hook             Sleep Summary  Oxygen Saturation Statistics   Start Study Time: End Study Time: Total Recording Time:  7:40:47 PM 4:15:30 AM 8 h, 85min  Total Sleep Time % REM of Sleep Time:  6 h, 15 min 13.8    Mean: 96 Minimum: 88 Maximum: 100  Mean of Desaturations Nadirs (%):   94  Oxygen Desat. %:   4-9 10-20 >20 Total  Events Number Total    30  2 93.8 6.3  0 0.0  32 100.0  Oxygen Saturation: <90 <=88 <85 <80 <70  Duration (minutes): Sleep % 0.0 0.0  0.0 0.0  0.0 0.0 0.0 0.0 0.0 0.0     Respiratory Indices      Total Events REM NREM All Night  pRDI:  136  pAHI:  117 ODI:  32  pAHIc:  1 %  CSR: 0.0 46.8 46.8 10.6 0.0 22.0 18.0 5.2 0.3 25.0 21.5 5.9 0.2       Pulse Rate Statistics during Sleep (BPM)      Mean: 68 Minimum: 35 Maximum: 100    Indices are calculated using  technically valid sleep time of  5 h, 25 min. Central-Indices are calculated using technically valid sleep time of  4  h, 8 min. pRDI/pAHI are calculated using 02 desaturations ? 3% Sit N/A Body Position Statistics  Position Supine Prone Right Left Non-Supine  Sleep (min) 0.0 216.7 104.5 54.0 375.2  Sleep % 0.0 57.8 27.9 14.4 100.0  pRDI N/A 22.1 32.3 20.2 25.0  pAHI N/A 18.3 29.9 15.2 21.5  ODI N/A 3.5 9.6 8.4 5.9     Snoring Statistics Snoring Level (dB) >40 >50 >60 >70 >80 >Threshold (45)  Sleep (min) 246.1 18.0 6.5 0.0 0.0 29.3  Sleep % 65.6 4.8 1.7 0.0 0.0 7.8    Mean: 42 dB Sleep Stages Chart                                 pAHI=21.5                                             Mild              Moderate                    Severe

## 2019-07-11 NOTE — Addendum Note (Signed)
Addended by: Larey Seat on: 07/11/2019 06:17 PM   Modules accepted: Orders

## 2019-07-12 ENCOUNTER — Telehealth: Payer: Self-pay | Admitting: Neurology

## 2019-07-12 NOTE — Telephone Encounter (Signed)
-----   Message from Larey Seat, MD sent at 07/11/2019  6:17 PM EST ----- There was moderately-severe sleep apnea noted at AHI 21.5/h and  in REM sleep AHI exacerbated to 46.8/h- severe.  There was no clinically significant hypoxemia. A trend to  bradycardia was noted. Sleep became less fragmented after  midnight. There was also more apnea while sleeping on her right  side.     Recommendations:     CPAP intervention is recommended with an autotitration device of  6-16 cm water, heated humidity and mask of patient's choice.  Avoiding to sleep on the right side may also help, as will weight  loss. Not a candidate for dental device based on REM dependent  apnea.  Interpreting Physician: Asencion Partridge Dohmeier,MD

## 2019-07-12 NOTE — Telephone Encounter (Signed)
Pt has returned the call to RN Myriam Jacobson, she is asking for a call to discuss her sleep study results

## 2019-07-12 NOTE — Telephone Encounter (Signed)
Called the patient to advise of the sleep study results. Pt answered but asked to call back once she got off work. Will await her return call.

## 2019-07-13 ENCOUNTER — Encounter: Payer: Self-pay | Admitting: Neurology

## 2019-07-14 ENCOUNTER — Encounter: Payer: Self-pay | Admitting: *Deleted

## 2019-07-19 NOTE — Progress Notes (Deleted)
   Subjective:    Patient ID: Kathleen Ramos, female    DOB: 1975/11/03, 43 y.o.   MRN: [id number]  HPI:  Kathleen Ramos is here for CPE  Healthcare Maintenance: PAP- Mammogram- Immunizations-  Patient Care Team    Relationship Specialty Notifications Start End  Esaw Grandchild, NP PCP - General Family Medicine  05/23/19    Comment: Ned Card, MD Consulting Physician Orthopedic Surgery  10/02/16   Esaw Grandchild, NP  Family Medicine  05/23/19    Comment: Merged    Patient Active Problem List   Diagnosis Date Noted  . 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 Excessive daytime sleepiness 06/14/2019  . Inadequate sleep hygiene 06/14/2019  . Sleep deprivation 06/14/2019  . Healthcare maintenance 05/23/2019  . BMI 40.0-44.9, adult ([facility]) 05/23/2019  . Snoring 05/23/2019  . Family history of diabetes mellitus in first degree relative 10/01/2016  . Other fatigue 10/01/2016  . Routine physical examination 10/01/2016  . Morbid obesity ([facility]) 10/01/2016  . Right ankle pain 10/01/2016     Past Medical History:  Diagnosis Date  . Anemia      Past Surgical History:  Procedure Laterality Date  . FOOT SURGERY       Family History  Problem Relation Age of Onset  . Diabetes Mother   . Hyperlipidemia Mother   . Hypertension Mother   . Diabetes Father   . Stroke Father   . Healthy Sister   . Diabetes Brother   . Stroke Brother   . Seizures Brother   . Healthy Brother   . Diabetes Maternal Grandmother      Social History   Substance and Sexual Activity  Drug Use Never     Social History   Substance and Sexual Activity  Alcohol Use Yes   Comment: occassionally     Social History   Tobacco Use  Smoking Status Never Smoker  Smokeless Tobacco Never Used     Outpatient Encounter Medications as of 07/20/2019  Medication Sig  . B Complex-C-Folic Acid (B COMPLEX-VITAMIN C-FOLIC ACID) 1 MG tablet Take 1 tablet by mouth daily with breakfast.  . Vitamin D,  Ergocalciferol, (DRISDOL) 1.25 MG (50000 UT) CAPS capsule Take 1 capsule (50,000 Units total) by mouth every 7 (seven) days.   No facility-administered encounter medications on file as of 07/20/2019.    Allergies: Patient has no known allergies.  There is no height or weight on file to calculate BMI.  There were no vitals taken for this visit.     Review of Systems     Objective:   Physical Exam        Assessment & Plan:  No diagnosis found.  No problem-specific Assessment & Plan notes found for this encounter.    FOLLOW-UP:  No follow-ups on file.

## 2019-07-20 ENCOUNTER — Encounter: Payer: Managed Care, Other (non HMO) | Admitting: Adult Health

## 2019-07-27 NOTE — Telephone Encounter (Signed)
Called the patient and this is 3rd attempt to reach out to review with the patient. LVM for patient to return call

## 2019-08-04 ENCOUNTER — Encounter: Payer: Self-pay | Admitting: Neurology

## 2019-08-04 NOTE — Telephone Encounter (Signed)
This was final attempt to call. Advised the patient to call on Monday. I will resend a final mychart message to the patient.

## 2019-08-23 ENCOUNTER — Ambulatory Visit (INDEPENDENT_AMBULATORY_CARE_PROVIDER_SITE_OTHER): Payer: Managed Care, Other (non HMO) | Admitting: Adult Health

## 2019-08-23 ENCOUNTER — Other Ambulatory Visit: Payer: Self-pay

## 2019-08-23 ENCOUNTER — Encounter: Payer: Self-pay | Admitting: Adult Health

## 2019-08-23 DIAGNOSIS — Z0000 Encounter for general adult medical examination without abnormal findings: Secondary | ICD-10-CM

## 2019-08-23 DIAGNOSIS — Z72821 Inadequate sleep hygiene: Secondary | ICD-10-CM | POA: Diagnosis not present

## 2019-08-23 DIAGNOSIS — E559 Vitamin D deficiency, unspecified: Secondary | ICD-10-CM | POA: Diagnosis not present

## 2019-08-23 NOTE — Assessment & Plan Note (Signed)
Body mass index is 43.96 kg/m.  Current wt 284 Encouraged healthy eating and increased exercise

## 2019-08-23 NOTE — Progress Notes (Signed)
Subjective:    Patient ID: Kathleen Ramos, female    DOB: [birth year], 44 y.o.   MRN: [id number]  HPI:  Kathleen Ramos is here for CPE She has been taking once weekly Ergocalciferol plus once daily OTC Vit d 2000 IU- she reports decrease in fatigue. Sleep Study 07/2019- OSA + Neuro/Dr. Dohmeier recommended CPAP- she has not started, due to concerns about claustrophobia with the machine. Encouraged her to f/u with [facility] to further discuss starting CPAP-discussed the impact of OSA on cardiovascular system/overall health. She reports sleeping at odd times of day- 1300-1900 Discussed sleep hygiene techniques. She reports consuming more fast food/vending machine food - she hopes to re-start food prepping on weekends. She has not exercising regularly, however she enjoys walking and will resume when weather warms up. She continues to abstain from tobacco/vape/ETOH use.  05/23/2019 OV: Vit D- quite low- will re-start Ergocalciferol She also should take OTC Vit D 2,000 IU daily Will re-check level in 4 months CMP-stable CBC-stable A1c-5.6-just under pre-diabetes, please encourage her to reduce CHO/sugar and keep walking. The 10-year ASCVD risk score Kathleen Ramos DC Montez Ramos., et al., 2013) is: 0.7% Values used to calculate the score:  Age: 10 years  Sex: Female  Is Non-Hispanic African American: Yes  Diabetic: No  Tobacco smoker: No  Systolic Blood Pressure: 131 mmHg  Is BP treated: No  HDL Cholesterol: 58 mg/dL  Total Cholesterol: 027 mg/dL [id number] To help reduce Total and LDL cholesterol- reduce saturated fat and again keep up the walking.  Healthcare Maintenance: PAP-06/02/2019-normal Mammogram-UTD, 06/02/2019, normal Immunizations-UTD  Patient Care Team    Relationship Specialty Notifications Start End  Julaine Fusi, NP PCP - General Family Medicine  05/23/19    Comment: Theodore Demark, MD Consulting Physician Orthopedic Surgery  10/02/16   Julaine Fusi, NP  Family Medicine  05/23/19    Comment: Merged    Patient Active Problem List   Diagnosis Date Noted  . Vitamin D deficiency 08/23/2019  . 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 Excessive daytime sleepiness 06/14/2019  . Inadequate sleep hygiene 06/14/2019  . Sleep deprivation 06/14/2019  . Healthcare maintenance 05/23/2019  . BMI 40.0-44.9, adult (HCC) 05/23/2019  . Snoring 05/23/2019  . Family history of diabetes mellitus in first degree relative 10/01/2016  . Other fatigue 10/01/2016  . Routine physical examination 10/01/2016  . Morbid obesity (HCC) 10/01/2016  . Right ankle pain 10/01/2016     Past Medical History:  Diagnosis Date  . Anemia      Past Surgical History:  Procedure Laterality Date  . FOOT SURGERY       Family History  Problem Relation Age of Onset  . Diabetes Mother   . Hyperlipidemia Mother   . Hypertension Mother   . Diabetes Father   . Stroke Father   . Healthy Sister   . Diabetes Brother   . Stroke Brother   . Seizures Brother   . Healthy Brother   . Diabetes Maternal Grandmother      Social History   Substance and Sexual Activity  Drug Use Never     Social History   Substance and Sexual Activity  Alcohol Use Yes   Comment: occassionally     Social History   Tobacco Use  Smoking Status Never Smoker  Smokeless Tobacco Never Used     Outpatient Encounter Medications as of 08/23/2019  Medication Sig  . B Complex-C-Folic Acid (B COMPLEX-VITAMIN C-FOLIC ACID) 1 MG tablet Take 1 tablet by  mouth daily with breakfast.  . Vitamin D, Ergocalciferol, (DRISDOL) 1.25 MG (50000 UT) CAPS capsule Take 1 capsule (50,000 Units total) by mouth every 7 (seven) days.   No facility-administered encounter medications on file as of 08/23/2019.    Allergies: Patient has no known allergies.  Body mass index is 43.96 kg/m.  Blood pressure 121/73, pulse 86, temperature 99.3 F (37.4 C), temperature source Oral, height 5' 7.5" (1.715  m), weight 284 lb 14.4 oz (129.2 kg), last menstrual period 08/16/2019, SpO2 96 %.  Review of Systems  Constitutional: Positive for fatigue. Negative for activity change, appetite change, chills, diaphoresis, fever and unexpected weight change.  HENT: Negative for congestion.   Eyes: Negative for visual disturbance.  Respiratory: Negative for cough, chest tightness, shortness of breath, wheezing and stridor.   Cardiovascular: Negative for chest pain, palpitations and leg swelling.  Gastrointestinal: Negative for abdominal distention, abdominal pain, blood in stool, constipation, diarrhea, nausea and vomiting.  Endocrine: Negative for polydipsia, polyphagia and polyuria.  Genitourinary: Negative for difficulty urinating and flank pain.  Musculoskeletal: Negative for arthralgias, back pain, gait problem, joint swelling, myalgias, neck pain and neck stiffness.  Neurological: Negative for dizziness and headaches.  Hematological: Negative for adenopathy. Does not bruise/bleed easily.  Psychiatric/Behavioral: Positive for sleep disturbance. Negative for agitation, behavioral problems, confusion, decreased concentration, dysphoric mood, hallucinations, self-injury and suicidal ideas. The patient is not nervous/anxious and is not hyperactive.        Objective:   Physical Exam Vitals and nursing note reviewed.  Constitutional:      General: She is not in acute distress.    Appearance: Normal appearance. She is obese. She is not ill-appearing, toxic-appearing or diaphoretic.     Comments: Appears fatigued   HENT:     Head: Normocephalic and atraumatic.     Right Ear: Tympanic membrane, ear canal and external ear normal. There is no impacted cerumen.     Left Ear: Tympanic membrane, ear canal and external ear normal. There is no impacted cerumen.     Nose: Nose normal. No congestion.     Mouth/Throat:     Mouth: Mucous membranes are moist.     Pharynx: No oropharyngeal exudate.  Eyes:      Extraocular Movements: Extraocular movements intact.     Conjunctiva/sclera: Conjunctivae normal.     Pupils: Pupils are equal, round, and reactive to light.  Cardiovascular:     Rate and Rhythm: Normal rate and regular rhythm.     Pulses: Normal pulses.     Heart sounds: Normal heart sounds. No murmur. No friction rub. No gallop.   Pulmonary:     Effort: Pulmonary effort is normal. No respiratory distress.     Breath sounds: Normal breath sounds. No stridor. No wheezing, rhonchi or rales.  Chest:     Chest wall: No tenderness.  Abdominal:     General: Abdomen is protuberant. Bowel sounds are normal.     Palpations: Abdomen is soft.     Tenderness: There is no abdominal tenderness. There is no right CVA tenderness or left CVA tenderness.     Comments: Difficult to palpate abdominal organs due to body habitus   Musculoskeletal:        General: No tenderness. Normal range of motion.     Right lower leg: No edema.     Left lower leg: No edema.  Skin:    General: Skin is warm.     Capillary Refill: Capillary refill takes less than 2 seconds.  Neurological:     Mental Status: She is alert and oriented to person, place, and time.     Coordination: Coordination normal.  Psychiatric:        Attention and Perception: Attention and perception normal.        Mood and Affect: Mood normal. Affect is flat.        Speech: Speech normal.        Behavior: Behavior normal.        Thought Content: Thought content normal.        Cognition and Memory: Cognition and memory normal.        Judgment: Judgment normal.       Assessment & Plan:   1. Healthcare maintenance   2. Morbid obesity (HCC)   3. Inadequate sleep hygiene   4. Vitamin D deficiency     Healthcare maintenance Continue once weekly Ergocalciferol plus once daily OTC Vit d 2000- please schedule non-fasting lab appt in 4 weeks to have Vit D level re-checked. Remain well hydrated. Follow Heart Healthy diet Increase regular  exercise.  Recommend at least 30 minutes daily, 5 days per week of walking, biking, swimming, YouTube/Pinterest workout videos. Please schedule fasting labs Oct 2021, complete physical with primary care Jan 2022. Continue with OB/GYN as directed. Please ask your supervisor about assistance with scheduling your COVID-19 vaccine. Continue to social distance and wear a mask when in public.  Morbid obesity (HCC) Body mass index is 43.96 kg/m.  Current wt 284 Encouraged healthy eating and increased exercise   Inadequate sleep hygiene Recently dx'd with OSA Encouraged to f/u with Neuro to discuss starting CPAP   Vitamin D deficiency She has been taking once weekly Ergocalciferol plus once daily OTC Vit d 2000 IU- she reports decrease in fatigue. Re-check level in 4 weeks     FOLLOW-UP:  Return in about 4 weeks (around 09/20/2019) for Vit D Level Check.

## 2019-08-23 NOTE — Patient Instructions (Addendum)
Preventive Care for Adults, Female  A healthy lifestyle and preventive care can promote health and wellness. Preventive health guidelines for women include the following key practices.   A routine yearly physical is a good way to check with your health care provider about your health and preventive screening. It is a chance to share any concerns and updates on your health and to receive a thorough exam.   Visit your dentist for a routine exam and preventive care every 6 months. Brush your teeth twice a day and floss once a day. Good oral hygiene prevents tooth decay and gum disease.   The frequency of eye exams is based on your age, health, family medical history, use of contact lenses, and other factors. Follow your health care provider's recommendations for frequency of eye exams.   Eat a healthy diet. Foods like vegetables, fruits, whole grains, low-fat dairy products, and lean protein foods contain the nutrients you need without too many calories. Decrease your intake of foods high in solid fats, added sugars, and salt. Eat the right amount of calories for you.Get information about a proper diet from your health care provider, if necessary.   Regular physical exercise is one of the most important things you can do for your health. Most adults should get at least 150 minutes of moderate-intensity exercise (any activity that increases your heart rate and causes you to sweat) each week. In addition, most adults need muscle-strengthening exercises on 2 or more days a week.   Maintain a healthy weight. The body mass index (BMI) is a screening tool to identify possible weight problems. It provides an estimate of body fat based on height and weight. Your health care provider can find your BMI, and can help you achieve or maintain a healthy weight.For adults 20 years and older:   - A BMI below 18.5 is considered underweight.   - A BMI of 18.5 to 24.9 is normal.   - A BMI of 25 to 29.9 is  considered overweight.   - A BMI of 30 and above is considered obese.   Maintain normal blood lipids and cholesterol levels by exercising and minimizing your intake of trans and saturated fats.  Eat a balanced diet with plenty of fruit and vegetables. Blood tests for lipids and cholesterol should begin at age 20 and be repeated every 5 years minimum.  If your lipid or cholesterol levels are high, you are over 40, or you are at high risk for heart disease, you may need your cholesterol levels checked more frequently.Ongoing high lipid and cholesterol levels should be treated with medicines if diet and exercise are not working.   If you smoke, find out from your health care provider how to quit. If you do not use tobacco, do not start.   Lung cancer screening is recommended for adults aged 55-80 years who are at high risk for developing lung cancer because of a history of smoking. A yearly low-dose CT scan of the lungs is recommended for people who have at least a 30-pack-year history of smoking and are a current smoker or have quit within the past 15 years. A pack year of smoking is smoking an average of 1 pack of cigarettes a day for 1 year (for example: 1 pack a day for 30 years or 2 packs a day for 15 years). Yearly screening should continue until the smoker has stopped smoking for at least 15 years. Yearly screening should be stopped for people who develop a   health problem that would prevent them from having lung cancer treatment.   If you are pregnant, do not drink alcohol. If you are breastfeeding, be very cautious about drinking alcohol. If you are not pregnant and choose to drink alcohol, do not have more than 1 drink per day. One drink is considered to be 12 ounces (355 mL) of beer, 5 ounces (148 mL) of wine, or 1.5 ounces (44 mL) of liquor.   Avoid use of street drugs. Do not share needles with anyone. Ask for help if you need support or instructions about stopping the use of  drugs.   High blood pressure causes heart disease and increases the risk of stroke. Your blood pressure should be checked at least yearly.  Ongoing high blood pressure should be treated with medicines if weight loss and exercise do not work.   If you are 69-55 years old, ask your health care provider if you should take aspirin to prevent strokes.   Diabetes screening involves taking a blood sample to check your fasting blood sugar level. This should be done once every 3 years, after age 38, if you are within normal weight and without risk factors for diabetes. Testing should be considered at a younger age or be carried out more frequently if you are overweight and have at least 1 risk factor for diabetes.   Breast cancer screening is essential preventive care for women. You should practice "breast self-awareness."  This means understanding the normal appearance and feel of your breasts and may include breast self-examination.  Any changes detected, no matter how small, should be reported to a health care provider.  Women in their 80s and 30s should have a clinical breast exam (CBE) by a health care provider as part of a regular health exam every 1 to 3 years.  After age 66, women should have a CBE every year.  Starting at age 1, women should consider having a mammogram (breast X-ray test) every year.  Women who have a family history of breast cancer should talk to their health care provider about genetic screening.  Women at a high risk of breast cancer should talk to their health care providers about having an MRI and a mammogram every year.   -Breast cancer gene (BRCA)-related cancer risk assessment is recommended for women who have family members with BRCA-related cancers. BRCA-related cancers include breast, ovarian, tubal, and peritoneal cancers. Having family members with these cancers may be associated with an increased risk for harmful changes (mutations) in the breast cancer genes BRCA1 and  BRCA2. Results of the assessment will determine the need for genetic counseling and BRCA1 and BRCA2 testing.   The Pap test is a screening test for cervical cancer. A Pap test can show cell changes on the cervix that might become cervical cancer if left untreated. A Pap test is a procedure in which cells are obtained and examined from the lower end of the uterus (cervix).   - Women should have a Pap test starting at age 57.   - Between ages 90 and 70, Pap tests should be repeated every 2 years.   - Beginning at age 63, you should have a Pap test every 3 years as long as the past 3 Pap tests have been normal.   - Some women have medical problems that increase the chance of getting cervical cancer. Talk to your health care provider about these problems. It is especially important to talk to your health care provider if a  new problem develops soon after your last Pap test. In these cases, your health care provider may recommend more frequent screening and Pap tests.   - The above recommendations are the same for women who have or have not gotten the vaccine for human papillomavirus (HPV).   - If you had a hysterectomy for a problem that was not cancer or a condition that could lead to cancer, then you no longer need Pap tests. Even if you no longer need a Pap test, a regular exam is a good idea to make sure no other problems are starting.   - If you are between ages 36 and 66 years, and you have had normal Pap tests going back 10 years, you no longer need Pap tests. Even if you no longer need a Pap test, a regular exam is a good idea to make sure no other problems are starting.   - If you have had past treatment for cervical cancer or a condition that could lead to cancer, you need Pap tests and screening for cancer for at least 20 years after your treatment.   - If Pap tests have been discontinued, risk factors (such as a new sexual partner) need to be reassessed to determine if screening should  be resumed.   - The HPV test is an additional test that may be used for cervical cancer screening. The HPV test looks for the virus that can cause the cell changes on the cervix. The cells collected during the Pap test can be tested for HPV. The HPV test could be used to screen women aged 70 years and older, and should be used in women of any age who have unclear Pap test results. After the age of 67, women should have HPV testing at the same frequency as a Pap test.   Colorectal cancer can be detected and often prevented. Most routine colorectal cancer screening begins at the age of 57 years and continues through age 26 years. However, your health care provider may recommend screening at an earlier age if you have risk factors for colon cancer. On a yearly basis, your health care provider may provide home test kits to check for hidden blood in the stool.  Use of a small camera at the end of a tube, to directly examine the colon (sigmoidoscopy or colonoscopy), can detect the earliest forms of colorectal cancer. Talk to your health care provider about this at age 23, when routine screening begins. Direct exam of the colon should be repeated every 5 -10 years through age 49 years, unless early forms of pre-cancerous polyps or small growths are found.   People who are at an increased risk for hepatitis B should be screened for this virus. You are considered at high risk for hepatitis B if:  -You were born in a country where hepatitis B occurs often. Talk with your health care provider about which countries are considered high risk.  - Your parents were born in a high-risk country and you have not received a shot to protect against hepatitis B (hepatitis B vaccine).  - You have HIV or AIDS.  - You use needles to inject street drugs.  - You live with, or have sex with, someone who has Hepatitis B.  - You get hemodialysis treatment.  - You take certain medicines for conditions like cancer, organ  transplantation, and autoimmune conditions.   Hepatitis C blood testing is recommended for all people born from [birth year] through 1965 and any individual  with known risks for hepatitis C.   Practice safe sex. Use condoms and avoid high-risk sexual practices to reduce the spread of sexually transmitted infections (STIs). STIs include gonorrhea, chlamydia, syphilis, trichomonas, herpes, HPV, and human immunodeficiency virus (HIV). Herpes, HIV, and HPV are viral illnesses that have no cure. They can result in disability, cancer, and death. Sexually active women aged 25 years and younger should be checked for chlamydia. Older women with new or multiple partners should also be tested for chlamydia. Testing for other STIs is recommended if you are sexually active and at increased risk.   Osteoporosis is a disease in which the bones lose minerals and strength with aging. This can result in serious bone fractures or breaks. The risk of osteoporosis can be identified using a bone density scan. Women ages 65 years and over and women at risk for fractures or osteoporosis should discuss screening with their health care providers. Ask your health care provider whether you should take a calcium supplement or vitamin D to There are also several preventive steps women can take to avoid osteoporosis and resulting fractures or to keep osteoporosis from worsening. -->Recommendations include:  Eat a balanced diet high in fruits, vegetables, calcium, and vitamins.  Get enough calcium. The recommended total intake of is 1,200 mg daily; for best absorption, if taking supplements, divide doses into 250-500 mg doses throughout the day. Of the two types of calcium, calcium carbonate is best absorbed when taken with food but calcium citrate can be taken on an empty stomach.  Get enough vitamin D. NAMS and the National Osteoporosis Foundation recommend at least 1,000 IU per day for women age 50 and over who are at risk of vitamin D  deficiency. Vitamin D deficiency can be caused by inadequate sun exposure (for example, those who live in northern latitudes).  Avoid alcohol and smoking. Heavy alcohol intake (more than 7 drinks per week) increases the risk of falls and hip fracture and women smokers tend to lose bone more rapidly and have lower bone mass than nonsmokers. Stopping smoking is one of the most important changes women can make to improve their health and decrease risk for disease.  Be physically active every day. Weight-bearing exercise (for example, fast walking, hiking, jogging, and weight training) may strengthen bones or slow the rate of bone loss that comes with aging. Balancing and muscle-strengthening exercises can reduce the risk of falling and fracture.  Consider therapeutic medications. Currently, several types of effective drugs are available. Healthcare providers can recommend the type most appropriate for each woman.  Eliminate environmental factors that may contribute to accidents. Falls cause nearly 90% of all osteoporotic fractures, so reducing this risk is an important bone-health strategy. Measures include ample lighting, removing obstructions to walking, using nonskid rugs on floors, and placing mats and/or grab bars in showers.  Be aware of medication side effects. Some common medicines make bones weaker. These include a type of steroid drug called glucocorticoids used for arthritis and asthma, some antiseizure drugs, certain sleeping pills, treatments for endometriosis, and some cancer drugs. An overactive thyroid gland or using too much thyroid hormone for an underactive thyroid can also be a problem. If you are taking these medicines, talk to your doctor about what you can do to help protect your bones.reduce the rate of osteoporosis.    Menopause can be associated with physical symptoms and risks. Hormone replacement therapy is available to decrease symptoms and risks. You should talk to your  health care provider   about whether hormone replacement therapy is right for you.   Use sunscreen. Apply sunscreen liberally and repeatedly throughout the day. You should seek shade when your shadow is shorter than you. Protect yourself by wearing long sleeves, pants, a wide-brimmed hat, and sunglasses year round, whenever you are outdoors.   Once a month, do a whole body skin exam, using a mirror to look at the skin on your back. Tell your health care provider of new moles, moles that have irregular borders, moles that are larger than a pencil eraser, or moles that have changed in shape or color.   -Stay current with required vaccines (immunizations).   Influenza vaccine. All adults should be immunized every year.  Tetanus, diphtheria, and acellular pertussis (Td, Tdap) vaccine. Pregnant women should receive 1 dose of Tdap vaccine during each pregnancy. The dose should be obtained regardless of the length of time since the last dose. Immunization is preferred during the 27th 36th week of gestation. An adult who has not previously received Tdap or who does not know her vaccine status should receive 1 dose of Tdap. This initial dose should be followed by tetanus and diphtheria toxoids (Td) booster doses every 10 years. Adults with an unknown or incomplete history of completing a 3-dose immunization series with Td-containing vaccines should begin or complete a primary immunization series including a Tdap dose. Adults should receive a Td booster every 10 years.  Varicella vaccine. An adult without evidence of immunity to varicella should receive 2 doses or a second dose if she has previously received 1 dose. Pregnant females who do not have evidence of immunity should receive the first dose after pregnancy. This first dose should be obtained before leaving the health care facility. The second dose should be obtained 4 8 weeks after the first dose.  Human papillomavirus (HPV) vaccine. Females aged 13 26  years who have not received the vaccine previously should obtain the 3-dose series. The vaccine is not recommended for use in pregnant females. However, pregnancy testing is not needed before receiving a dose. If a female is found to be pregnant after receiving a dose, no treatment is needed. In that case, the remaining doses should be delayed until after the pregnancy. Immunization is recommended for any person with an immunocompromised condition through the age of 26 years if she did not get any or all doses earlier. During the 3-dose series, the second dose should be obtained 4 8 weeks after the first dose. The third dose should be obtained 24 weeks after the first dose and 16 weeks after the second dose.  Zoster vaccine. One dose is recommended for adults aged 60 years or older unless certain conditions are present.  Measles, mumps, and rubella (MMR) vaccine. Adults born before 1957 generally are considered immune to measles and mumps. Adults born in 1957 or later should have 1 or more doses of MMR vaccine unless there is a contraindication to the vaccine or there is laboratory evidence of immunity to each of the three diseases. A routine second dose of MMR vaccine should be obtained at least 28 days after the first dose for students attending postsecondary schools, health care workers, or international travelers. People who received inactivated measles vaccine or an unknown type of measles vaccine during 1963 1967 should receive 2 doses of MMR vaccine. People who received inactivated mumps vaccine or an unknown type of mumps vaccine before 1979 and are at high risk for mumps infection should consider immunization with 2 doses of   MMR vaccine. For females of childbearing age, rubella immunity should be determined. If there is no evidence of immunity, females who are not pregnant should be vaccinated. If there is no evidence of immunity, females who are pregnant should delay immunization until after pregnancy.  Unvaccinated health care workers born before [birth year] who lack laboratory evidence of measles, mumps, or rubella immunity or laboratory confirmation of disease should consider measles and mumps immunization with 2 doses of MMR vaccine or rubella immunization with 1 dose of MMR vaccine.  Pneumococcal 13-valent conjugate (PCV13) vaccine. When indicated, a person who is uncertain of her immunization history and has no record of immunization should receive the PCV13 vaccine. An adult aged 54 years or older who has certain medical conditions and has not been previously immunized should receive 1 dose of PCV13 vaccine. This PCV13 should be followed with a dose of pneumococcal polysaccharide (PPSV23) vaccine. The PPSV23 vaccine dose should be obtained at least 8 weeks after the dose of PCV13 vaccine. An adult aged 58 years or older who has certain medical conditions and previously received 1 or more doses of PPSV23 vaccine should receive 1 dose of PCV13. The PCV13 vaccine dose should be obtained 1 or more years after the last PPSV23 vaccine dose.  Pneumococcal polysaccharide (PPSV23) vaccine. When PCV13 is also indicated, PCV13 should be obtained first. All adults aged 58 years and older should be immunized. An adult younger than age 65 years who has certain medical conditions should be immunized. Any person who resides in a nursing home or long-term care facility should be immunized. An adult smoker should be immunized. People with an immunocompromised condition and certain other conditions should receive both PCV13 and PPSV23 vaccines. People with human immunodeficiency virus (HIV) infection should be immunized as soon as possible after diagnosis. Immunization during chemotherapy or radiation therapy should be avoided. Routine use of PPSV23 vaccine is not recommended for American Indians, [place] Natives, or people younger than 65 years unless there are medical conditions that require PPSV23 vaccine. When indicated,  people who have unknown immunization and have no record of immunization should receive PPSV23 vaccine. One-time revaccination 5 years after the first dose of PPSV23 is recommended for people aged 70 64 years who have chronic kidney failure, nephrotic syndrome, asplenia, or immunocompromised conditions. People who received 1 2 doses of PPSV23 before age 32 years should receive another dose of PPSV23 vaccine at age 96 years or later if at least 5 years have passed since the previous dose. Doses of PPSV23 are not needed for people immunized with PPSV23 at or after age 55 years.  Meningococcal vaccine. Adults with asplenia or persistent complement component deficiencies should receive 2 doses of quadrivalent meningococcal conjugate (MenACWY-D) vaccine. The doses should be obtained at least 2 months apart. Microbiologists working with certain meningococcal bacteria, [employer] recruits, people at risk during an outbreak, and people who travel to or live in countries with a high rate of meningitis should be immunized. A first-year college student up through age 58 years who is living in a residence hall should receive a dose if she did not receive a dose on or after her 16th birthday. Adults who have certain high-risk conditions should receive one or more doses of vaccine.  Hepatitis A vaccine. Adults who wish to be protected from this disease, have certain high-risk conditions, work with hepatitis A-infected animals, work in hepatitis A research labs, or travel to or work in countries with a high rate of hepatitis A should be  immunized. Adults who were previously unvaccinated and who anticipate close contact with an international adoptee during the first 60 days after arrival in the [place] from a country with a high rate of hepatitis A should be immunized.  Hepatitis B vaccine.  Adults who wish to be protected from this disease, have certain high-risk conditions, may be exposed to blood or other infectious  body fluids, are household contacts or sex partners of hepatitis B positive people, are clients or workers in certain care facilities, or travel to or work in countries with a high rate of hepatitis B should be immunized.  Haemophilus influenzae type b (Hib) vaccine. A previously unvaccinated person with asplenia or sickle cell disease or having a scheduled splenectomy should receive 1 dose of Hib vaccine. Regardless of previous immunization, a recipient of a hematopoietic stem cell transplant should receive a 3-dose series 6 12 months after her successful transplant. Hib vaccine is not recommended for adults with HIV infection.  Preventive Services / Frequency Ages 6 to 39years  Blood pressure check.** / Every 1 to 2 years.  Lipid and cholesterol check.** / Every 5 years beginning at age 39.  Clinical breast exam.** / Every 3 years for women in their 61s and 62s.  BRCA-related cancer risk assessment.** / For women who have family members with a BRCA-related cancer (breast, ovarian, tubal, or peritoneal cancers).  Pap test.** / Every 2 years from ages 47 through 85. Every 3 years starting at age 34 through age 12 or 74 with a history of 3 consecutive normal Pap tests.  HPV screening.** / Every 3 years from ages 46 through ages 43 to 54 with a history of 3 consecutive normal Pap tests.  Hepatitis C blood test.** / For any individual with known risks for hepatitis C.  Skin self-exam. / Monthly.  Influenza vaccine. / Every year.  Tetanus, diphtheria, and acellular pertussis (Tdap, Td) vaccine.** / Consult your health care provider. Pregnant women should receive 1 dose of Tdap vaccine during each pregnancy. 1 dose of Td every 10 years.  Varicella vaccine.** / Consult your health care provider. Pregnant females who do not have evidence of immunity should receive the first dose after pregnancy.  HPV vaccine. / 3 doses over 6 months, if 64 and younger. The vaccine is not recommended for use in  pregnant females. However, pregnancy testing is not needed before receiving a dose.  Measles, mumps, rubella (MMR) vaccine.** / You need at least 1 dose of MMR if you were born in 1957 or later. You may also need a 2nd dose. For females of childbearing age, rubella immunity should be determined. If there is no evidence of immunity, females who are not pregnant should be vaccinated. If there is no evidence of immunity, females who are pregnant should delay immunization until after pregnancy.  Pneumococcal 13-valent conjugate (PCV13) vaccine.** / Consult your health care provider.  Pneumococcal polysaccharide (PPSV23) vaccine.** / 1 to 2 doses if you smoke cigarettes or if you have certain conditions.  Meningococcal vaccine.** / 1 dose if you are age 71 to 37 years and a Market researcher living in a residence hall, or have one of several medical conditions, you need to get vaccinated against meningococcal disease. You may also need additional booster doses.  Hepatitis A vaccine.** / Consult your health care provider.  Hepatitis B vaccine.** / Consult your health care provider.  Haemophilus influenzae type b (Hib) vaccine.** / Consult your health care provider.  Ages 55 to 64years  Blood pressure check.** / Every 1 to 2 years.  Lipid and cholesterol check.** / Every 5 years beginning at age 20 years.  Lung cancer screening. / Every year if you are aged 55 80 years and have a 30-pack-year history of smoking and currently smoke or have quit within the past 15 years. Yearly screening is stopped once you have quit smoking for at least 15 years or develop a health problem that would prevent you from having lung cancer treatment.  Clinical breast exam.** / Every year after age 40 years.  BRCA-related cancer risk assessment.** / For women who have family members with a BRCA-related cancer (breast, ovarian, tubal, or peritoneal cancers).  Mammogram.** / Every year beginning at age 40  years and continuing for as long as you are in good health. Consult with your health care provider.  Pap test.** / Every 3 years starting at age 30 years through age 65 or 70 years with a history of 3 consecutive normal Pap tests.  HPV screening.** / Every 3 years from ages 30 years through ages 65 to 70 years with a history of 3 consecutive normal Pap tests.  Fecal occult blood test (FOBT) of stool. / Every year beginning at age 50 years and continuing until age 75 years. You may not need to do this test if you get a colonoscopy every 10 years.  Flexible sigmoidoscopy or colonoscopy.** / Every 5 years for a flexible sigmoidoscopy or every 10 years for a colonoscopy beginning at age 50 years and continuing until age 75 years.  Hepatitis C blood test.** / For all people born from 1945 through 1965 and any individual with known risks for hepatitis C.  Skin self-exam. / Monthly.  Influenza vaccine. / Every year.  Tetanus, diphtheria, and acellular pertussis (Tdap/Td) vaccine.** / Consult your health care provider. Pregnant women should receive 1 dose of Tdap vaccine during each pregnancy. 1 dose of Td every 10 years.  Varicella vaccine.** / Consult your health care provider. Pregnant females who do not have evidence of immunity should receive the first dose after pregnancy.  Zoster vaccine.** / 1 dose for adults aged 60 years or older.  Measles, mumps, rubella (MMR) vaccine.** / You need at least 1 dose of MMR if you were born in 1957 or later. You may also need a 2nd dose. For females of childbearing age, rubella immunity should be determined. If there is no evidence of immunity, females who are not pregnant should be vaccinated. If there is no evidence of immunity, females who are pregnant should delay immunization until after pregnancy.  Pneumococcal 13-valent conjugate (PCV13) vaccine.** / Consult your health care provider.  Pneumococcal polysaccharide (PPSV23) vaccine.** / 1 to 2 doses if  you smoke cigarettes or if you have certain conditions.  Meningococcal vaccine.** / Consult your health care provider.  Hepatitis A vaccine.** / Consult your health care provider.  Hepatitis B vaccine.** / Consult your health care provider.  Haemophilus influenzae type b (Hib) vaccine.** / Consult your health care provider.  Ages 65 years and over  Blood pressure check.** / Every 1 to 2 years.  Lipid and cholesterol check.** / Every 5 years beginning at age 20 years.  Lung cancer screening. / Every year if you are aged 55 80 years and have a 30-pack-year history of smoking and currently smoke or have quit within the past 15 years. Yearly screening is stopped once you have quit smoking for at least 15 years or develop a health problem that   would prevent you from having lung cancer treatment.  Clinical breast exam.** / Every year after age 103 years.  BRCA-related cancer risk assessment.** / For women who have family members with a BRCA-related cancer (breast, ovarian, tubal, or peritoneal cancers).  Mammogram.** / Every year beginning at age 36 years and continuing for as long as you are in good health. Consult with your health care provider.  Pap test.** / Every 3 years starting at age 5 years through age 85 or 10 years with 3 consecutive normal Pap tests. Testing can be stopped between 65 and 70 years with 3 consecutive normal Pap tests and no abnormal Pap or HPV tests in the past 10 years.  HPV screening.** / Every 3 years from ages 93 years through ages 70 or 45 years with a history of 3 consecutive normal Pap tests. Testing can be stopped between 65 and 70 years with 3 consecutive normal Pap tests and no abnormal Pap or HPV tests in the past 10 years.  Fecal occult blood test (FOBT) of stool. / Every year beginning at age 8 years and continuing until age 45 years. You may not need to do this test if you get a colonoscopy every 10 years.  Flexible sigmoidoscopy or colonoscopy.** /  Every 5 years for a flexible sigmoidoscopy or every 10 years for a colonoscopy beginning at age 69 years and continuing until age 68 years.  Hepatitis C blood test.** / For all people born from [birth year] through 1963 and any individual with known risks for hepatitis C.  Osteoporosis screening.** / A one-time screening for women ages 7 years and over and women at risk for fractures or osteoporosis.  Skin self-exam. / Monthly.  Influenza vaccine. / Every year.  Tetanus, diphtheria, and acellular pertussis (Tdap/Td) vaccine.** / 1 dose of Td every 10 years.  Varicella vaccine.** / Consult your health care provider.  Zoster vaccine.** / 1 dose for adults aged 5 years or older.  Pneumococcal 13-valent conjugate (PCV13) vaccine.** / Consult your health care provider.  Pneumococcal polysaccharide (PPSV23) vaccine.** / 1 dose for all adults aged 74 years and older.  Meningococcal vaccine.** / Consult your health care provider.  Hepatitis A vaccine.** / Consult your health care provider.  Hepatitis B vaccine.** / Consult your health care provider.  Haemophilus influenzae type b (Hib) vaccine.** / Consult your health care provider. ** Family history and personal history of risk and conditions may change your health care provider's recommendations. Document Released: 09/16/2001 Document Revised: 05/11/2013  [facility] Patient Information 2014 [place], [place].   EXERCISE AND DIET:  We recommended that you start or continue a regular exercise program for good health. Regular exercise means any activity that makes your heart beat faster and makes you sweat.  We recommend exercising at least 30 minutes per day at least 3 days a week, preferably 5.  We also recommend a diet low in fat and sugar / carbohydrates.  Inactivity, poor dietary choices and obesity can cause diabetes, heart attack, stroke, and kidney damage, among others.     ALCOHOL AND SMOKING:  Women should limit their alcohol intake to no  more than 7 drinks/beers/glasses of wine (combined, not each!) per week. Moderation of alcohol intake to this level decreases your risk of breast cancer and liver damage.  ( And of course, no recreational drugs are part of a healthy lifestyle.)  Also, you should not be smoking at all or even being exposed to second hand smoke. Most people know smoking can  cause cancer, and various heart and lung diseases, but did you know it also contributes to weakening of your bones?  Aging of your skin?  Yellowing of your teeth and nails?   CALCIUM AND VITAMIN D:  Adequate intake of calcium and Vitamin D are recommended.  The recommendations for exact amounts of these supplements seem to change often, but generally speaking 600 mg of calcium (either carbonate or citrate) and 800 units of Vitamin D per day seems prudent. Certain women may benefit from higher intake of Vitamin D.  If you are among these women, your doctor will have told you during your visit.     PAP SMEARS:  Pap smears, to check for cervical cancer or precancers,  have traditionally been done yearly, although recent scientific advances have shown that most women can have pap smears less often.  However, every woman still should have a physical exam from her gynecologist or primary care physician every year. It will include a breast check, inspection of the vulva and vagina to check for abnormal growths or skin changes, a visual exam of the cervix, and then an exam to evaluate the size and shape of the uterus and ovaries.  And after 42 years of age, a rectal exam is indicated to check for rectal cancers. We will also provide age appropriate advice regarding health maintenance, like when you should have certain vaccines, screening for sexually transmitted diseases, bone density testing, colonoscopy, mammograms, etc.    MAMMOGRAMS:  All women over 73 years old should have a yearly mammogram. Many facilities now offer a "3D" mammogram, which may cost  around $50 extra out of pocket. If possible,  we recommend you accept the option to have the 3D mammogram performed.  It both reduces the number of women who will be called back for extra views which then turn out to be normal, and it is better than the routine mammogram at detecting truly abnormal areas.     COLONOSCOPY:  Colonoscopy to screen for colon cancer is recommended for all women at age 13.  We know, you hate the idea of the prep.  We agree, BUT, having colon cancer and not knowing it is worse!!  Colon cancer so often starts as a polyp that can be seen and removed at colonscopy, which can quite literally save your life!  And if your first colonoscopy is normal and you have no family history of colon cancer, most women don't have to have it again for 10 years.  Once every ten years, you can do something that may end up saving your life, right?  We will be happy to help you get it scheduled when you are ready.  Be sure to check your insurance coverage so you understand how much it will cost.  It may be covered as a preventative service at no cost, but you should check your particular policy.    Continue once weekly Ergocalciferol plus once daily OTC Vit d 2000- please schedule non-fasting lab appt in 4 weeks to have Vit D level re-checked. Remain well hydrated. Follow Heart Healthy diet Increase regular exercise.  Recommend at least 30 minutes daily, 5 days per week of walking, biking, swimming, YouTube/Pinterest workout videos. Please schedule fasting labs Oct 2021, complete physical with primary care Jan 2022. Continue with OB/GYN as directed. Please ask your supervisor about assistance with scheduling your COVID-19 vaccine. Continue to social distance and wear a mask when in public. GREAT TO SEE YOU!

## 2019-08-23 NOTE — Assessment & Plan Note (Signed)
She has been taking once weekly Ergocalciferol plus once daily OTC Vit d 2000 IU- she reports decrease in fatigue. Re-check level in 4 weeks

## 2019-08-23 NOTE — Assessment & Plan Note (Signed)
Continue once weekly Ergocalciferol plus once daily OTC Vit d 2000- please schedule non-fasting lab appt in 4 weeks to have Vit D level re-checked. Remain well hydrated. Follow Heart Healthy diet Increase regular exercise.  Recommend at least 30 minutes daily, 5 days per week of walking, biking, swimming, YouTube/Pinterest workout videos. Please schedule fasting labs Oct 2021, complete physical with primary care Jan 2022. Continue with OB/GYN as directed. Please ask your supervisor about assistance with scheduling your COVID-19 vaccine. Continue to social distance and wear a mask when in public.

## 2019-08-23 NOTE — Assessment & Plan Note (Signed)
Recently dx'd with OSA Encouraged to f/u with Neuro to discuss starting CPAP

## 2019-09-06 ENCOUNTER — Telehealth: Payer: Self-pay | Admitting: Adult Health

## 2019-09-06 DIAGNOSIS — E559 Vitamin D deficiency, unspecified: Secondary | ICD-10-CM

## 2019-09-06 NOTE — Telephone Encounter (Signed)
[employer] returned request for pt's last diabetic eye exam report states doctor left & took all his records with him (this pt's were taken as well).  --Forwarding finding to med asst to document pt's Health Maintenance report.  --glh

## 2019-09-14 ENCOUNTER — Other Ambulatory Visit: Payer: Self-pay | Admitting: Family Medicine

## 2019-09-14 DIAGNOSIS — E559 Vitamin D deficiency, unspecified: Secondary | ICD-10-CM

## 2019-09-14 NOTE — Progress Notes (Signed)
Continue once weekly Ergocalciferol plus once daily OTC Vit d 2000- please schedule non-fasting lab appt in 4 weeks to have Vit D level re-checked. Per note with Katy on 08/23/19. AS, CMA

## 2019-09-20 ENCOUNTER — Other Ambulatory Visit: Payer: Self-pay

## 2019-09-20 ENCOUNTER — Other Ambulatory Visit: Payer: Managed Care, Other (non HMO)

## 2019-09-20 DIAGNOSIS — E559 Vitamin D deficiency, unspecified: Secondary | ICD-10-CM

## 2019-09-21 LAB — VITAMIN D 25 HYDROXY (VIT D DEFICIENCY, FRACTURES): Vit D, 25-Hydroxy: 8.9 ng/mL — ABNORMAL LOW (ref 30.0–100.0)

## 2019-09-26 NOTE — Telephone Encounter (Signed)
Patient is aware of lab results and says that she has been taking the rx vit d once weekly as directed. Patient is going to add 2000iu of Vit D3 OTC daily and continue with Vit D rx once weekly. Patient is aware lab to be rechecked in 3-4 months and future labs ordered. Call transferred to front desk for scheduling. AS< CMA

## 2019-09-26 NOTE — Telephone Encounter (Signed)
-----   Message from Thomasene Lot, DO sent at 09/21/2019  6:13 PM EST ----- Please call patient and see if she is really taking the once weekly vitamin D on a regular basis.  Let her know it is okay if she is not taking it every week however we really need to know how consistent she is with it to determine the next treatment steps.    If she is not religiously taking it every week that I recommend she do so however, if she is taking it regularly, every single week, and have her add a 2000 IU vitamin D3 over-the-counter daily.  Recheck 3 to 4 months  Thanks.

## 2019-09-27 ENCOUNTER — Other Ambulatory Visit: Payer: Self-pay | Admitting: Adult Health

## 2019-09-29 ENCOUNTER — Telehealth: Payer: Self-pay

## 2019-09-29 MED ORDER — VITAMIN D (ERGOCALCIFEROL) 1.25 MG (50000 UNIT) PO CAPS: 50000.0000 [IU] | ORAL_CAPSULE | ORAL | 0 refills | Status: DC

## 2019-09-29 NOTE — Telephone Encounter (Signed)
Please call pt to schedule appt for vitamin D level only.  No further refills until pt is seen.  Tiajuana Amass, CMA

## 2019-11-15 ENCOUNTER — Telehealth: Payer: Self-pay | Admitting: Adult Health

## 2019-11-15 NOTE — Telephone Encounter (Signed)
Patient called states she has been gaining weight lately & wishes to discuss treatment plan for management.  --Forwarding request to med asst for review w/ provider.  --glh

## 2019-11-15 NOTE — Telephone Encounter (Signed)
Patient would need an appointment to discuss weight gain and treatment with provider. Please contact patient to schedule appointment.

## 2019-11-15 NOTE — Telephone Encounter (Signed)
Please contact patient to schedule apt with provider to discuss weight gain and treatment.

## 2019-12-05 ENCOUNTER — Other Ambulatory Visit: Payer: Self-pay

## 2019-12-05 ENCOUNTER — Ambulatory Visit (INDEPENDENT_AMBULATORY_CARE_PROVIDER_SITE_OTHER): Payer: Managed Care, Other (non HMO) | Admitting: Physician Assistant

## 2019-12-05 ENCOUNTER — Encounter: Payer: Self-pay | Admitting: Physician Assistant

## 2019-12-05 DIAGNOSIS — Z713 Dietary counseling and surveillance: Secondary | ICD-10-CM

## 2019-12-05 DIAGNOSIS — F32A Depression, unspecified: Secondary | ICD-10-CM

## 2019-12-05 DIAGNOSIS — F329 Major depressive disorder, single episode, unspecified: Secondary | ICD-10-CM

## 2019-12-05 DIAGNOSIS — Z5181 Encounter for therapeutic drug level monitoring: Secondary | ICD-10-CM

## 2019-12-05 DIAGNOSIS — Z6841 Body Mass Index (BMI) 40.0 and over, adult: Secondary | ICD-10-CM

## 2019-12-05 MED ORDER — PHENTERMINE HCL 15 MG PO CAPS: 15.0000 mg | ORAL_CAPSULE | ORAL | 2 refills | Status: DC

## 2019-12-05 NOTE — Patient Instructions (Addendum)
Behavior Modification Ideas for Weight Management  Weight management involves adopting a healthy lifestyle that includes a knowledge of nutrition and exercise, a positive attitude and the right kind of motivation. Internal motives such as better health, increased energy, self-esteem and personal control increase your chances of lifelong weight management success.  Remember to have realistic goals and think long-term success. Believe in yourself and you can do it. The following information will give you ideas to help you meet your goals.  Control Your Home Environment  Eat only while sitting down at the kitchen or dining room table. Do not eat while watching television, reading, cooking, talking on the phone, standing at the refrigerator or working on the computer. Keep tempting foods out of the house - don't buy them. Keep tempting foods out of sight. Have low-calorie foods ready to eat. Unless you are preparing a meal, stay out of the kitchen. Have healthy snacks at your disposal, such as small pieces of fruit, vegetables, canned fruit, pretzels, low-fat string cheese and nonfat cottage cheese.  Control Your Work Environment  Do not eat at your desk or keep tempting snacks at your desk. If you get hungry between meals, plan healthy snacks and bring them with you to work. During your breaks, go for a walk instead of eating. If you work around food, plan in advance the one item you will eat at mealtime. Make it inconvenient to nibble on food by chewing gum, sugarless candy or drinking water or another low-calorie beverage. Do not work through meals. Skipping meals slows down metabolism and may result in overeating at the next meal. If food is available for special occasions, either pick the healthiest item, nibble on low-fat snacks brought from home, don't have anything offered, choose one option and have a small amount, or have only a beverage.  Control Your Mealtime Environment  Serve your  plate of food at the stove or kitchen counter. Do not put the serving dishes on the table. If you do put dishes on the table, remove them immediately when finished eating. Fill half of your plate with vegetables, a quarter with lean protein and a quarter with starch. Use smaller plates, bowls and glasses. A smaller portion will look large when it is in a little dish. Politely refuse second helpings. When fixing your plate, limit portions of food to one scoop/serving or less.   Daily Food Management  Replace eating with another activity that you will not associate with food. Wait 20 minutes before eating something you are craving. Drink a large glass of water or diet soda before eating. Always have a big glass or bottle of water to drink throughout the day. Avoid high-calorie add-ons such as cream with your coffee, butter, mayonnaise and salad dressings.  Shopping: Do not shop when hungry or tired. Shop from a list and avoid buying anything that is not on your list. If you must have tempting foods, buy individual-sized packages and try to find a lower-calorie alternative. Don't taste test in the store. Read food labels. Compare products to help you make the healthiest choices.  Preparation: Chew a piece of gum while cooking meals. Use a quarter teaspoon if you taste test your food. Try to only fix what you are going to eat, leaving yourself no chance for seconds. If you have prepared more food than you need, portion it into individual containers and freeze or refrigerate immediately. Don't snack while cooking meals.  Eating: Eat slowly. Remember it takes about 20 minutes   for your stomach to send a message to your brain that it is full. Don't let fake hunger make you think you need more. The ideal way to eat is to take a bite, put your utensil down, take a sip of water, cut your next bite, take a bit, put your utensil down and so on. Do not cut your food all at one time. Cut only as  needed. Take small bites and chew your food well. Stop eating for a minute or two at least once during a meal or snack. Take breaks to reflect and have conversation.  Cleanup and Leftovers: Label leftovers for a specific meal or snack. Freeze or refrigerate individual portions of leftovers. Do not clean up if you are still hungry.  Eating Out and Social Eating  Do not arrive hungry. Eat something light before the meal. Try to fill up on low-calorie foods, such as vegetables and fruit, and eat smaller portions of the high-calorie foods. Eat foods that you like, but choose small portions. If you want seconds, wait at least 20 minutes after you have eaten to see if you are actually hungry or if your eyes are bigger than your stomach. Limit alcoholic beverages. Try a soda water with a twist of lime. Do not skip other meals in the day to save room for the special event.  At Restaurants: Order  la carte rather than buffet style. Order some vegetables or a salad for an appetizer instead of eating bread. If you order a high-calorie dish, share it with someone. Try an after-dinner mint with your coffee. If you do have dessert, share it with two or more people. Don't overeat because you do not want to waste food. Ask for a doggie bag to take extra food home. Tell the server to put half of your entree in a to go bag before the meal is served to you. Ask for salad dressing, gravy or high-fat sauces on the side. Dip the tip of your fork in the dressing before each bite. If bread is served, ask for only one piece. Try it plain without butter or oil. At Italian restaurants where oil and vinegar is served with bread, use only a small amount of oil and a lot of vinegar for dipping.  At a Friend's House: Offer to bring a dish, appetizer or dessert that is low in calories. Serve yourself small portions or tell the host that you only want a small amount. Stand or sit away from the snack table. Stay away  from the kitchen or stay busy if you are near the food. Limit your alcohol intake.  At Buffets and Cafeterias: Cover most of your plate with lettuce and/or vegetables. Use a salad plate instead of a dinner plate. After eating, clear away your dishes before having coffee or tea.  Entertaining at Home: Explore low-fat, low-cholesterol cookbooks. Use single-serving foods like chicken breasts or hamburger patties. Prepare low-calorie appetizers and desserts.   Holidays: Keep tempting foods out of sight. Decorate the house without using food. Have low-calorie beverages and foods on hand for guests. Allow yourself one planned treat a day. Don't skip meals to save up for the holiday feast. Eat regular, planned meals.   Exercise Well  Make exercise a priority and a planned activity in the day. If possible, walk the entire or part of the distance to work. Get an exercise buddy. Go for a walk with a colleague during one of your breaks, go to the gym, run or   take a walk with a friend, walk in the mall with a shopping companion. Park at the end of the parking lot and walk to the store or office entrance. Always take the stairs all of the way or at least part of the way to your floor. If you have a desk job, walk around the office frequently. Do leg lifts while sitting at your desk. Do something outside on the weekends like going for a hike or a bike ride.   Have a Healthy Attitude  Make health your weight management priority. Be realistic. Have a goal to achieve a healthier you, not necessarily the lowest weight or ideal weight based on calculations or tables. Focus on a healthy eating style, not on dieting. Dieting usually lasts for a short amount of time and rarely produces long-term success. Think long term. You are developing new healthy behaviors to follow next month, in a year and in a decade.    This information is for educational purposes only and is not intended to replace the  advice of your doctor or health care provider. We encourage you to discuss with your doctor any questions or concerns you may have.        Guidelines for Losing Weight   We want weight loss that will last so you should lose 1-2 pounds a week.  THAT IS IT! Please pick THREE things a month to change. Once it is a habit check off the item. Then pick another three items off the list to become habits.  If you are already doing a habit on the list GREAT!  Cross that item off!  Don't drink your calories. Ie, alcohol, soda, fruit juice, and sweet tea.   Drink more water. Drink a glass when you feel hungry or before each meal.   Eat breakfast - Complex carb and protein (likeDannon light and fit yogurt, oatmeal, fruit, eggs, turkey bacon).  Measure your cereal.  Eat no more than one cup a day. (ie Kashi)  Eat an apple a day.  Add a vegetable a day.  Try a new vegetable a month.  Use Pam! Stop using oil or butter to cook.  Don't finish your plate or use smaller plates.  Share your dessert.  Eat sugar free Jello for dessert or frozen grapes.  Don't eat 2-3 hours before bed.  Switch to whole wheat bread, pasta, and Ducey rice.  Make healthier choices when you eat out. No fries!  Pick baked chicken, NOT fried.  Don't forget to SLOW DOWN when you eat. It is not going anywhere.   Take the stairs.  Park far away in the parking lot  Lift soup cans (or weights) for 10 minutes while watching TV.  Walk at work for 10 minutes during break.  Walk outside 1 time a week with your friend, kids, dog, or significant other.  Start a walking group at church.  Walk the mall as much as you can tolerate.   Keep a food diary.  Weigh yourself daily.  Walk for 15 minutes 3 days per week.  Cook at home more often and eat out less. If life happens and you go back to old habits, it is okay.  Just start over. You can do it!  If you experience chest pain, get short of breath, or tired  during the exercise, please stop immediately and inform your doctor.    Before you even begin to attack a weight-loss plan, it pays to remember this: You are not   fat. You have fat. Losing weight isn't about blame or shame; it's simply another achievement to accomplish. Dieting is like any other skill-you have to buckle down and work at it. As long as you act in a smart, reasonable way, you'll ultimately get where you want to be. Here are some weight loss pearls for you.   1. It's Not a Diet. It's a Lifestyle Thinking of a diet as something you're on and suffering through only for the short term doesn't work. To shed weight and keep it off, you need to make permanent changes to the way you eat. It's OK to indulge occasionally, of course, but if you cut calories temporarily and then revert to your old way of eating, you'll gain back the weight quicker than you can say yo-yo. Use it to lose it. Research shows that one of the best predictors of long-term weight loss is how many pounds you drop in the first month. For that reason, nutritionists often suggest being stricter for the first two weeks of your new eating strategy to build momentum. Cut out added sugar and alcohol and avoid unrefined carbs. After that, figure out how you can reincorporate them in a way that's healthy and maintainable.  2. There's a Right Way to Exercise Working out burns calories and fat and boosts your metabolism by building muscle. But those trying to lose weight are notorious for overestimating the number of calories they burn and underestimating the amount they take in. Unfortunately, your system is biologically programmed to hold on to extra pounds and that means when you start exercising, your body senses the deficit and ramps up its hunger signals. If you're not diligent, you'll eat everything you burn and then some. Use it, to lose it. Cardio gets all the exercise glory, but strength and interval training are the real heroes.  They help you build lean muscle, which in turn increases your metabolism and calorie-burning ability 3. Don't Overreact to Mild Hunger Some people have a hard time losing weight because of hunger anxiety. To them, being hungry is bad-something to be avoided at all costs-so they carry snacks with them and eat when they don't need to. Others eat because they're stressed out or bored. While you never want to get to the point of being ravenous (that's when bingeing is likely to happen), a hunger pang, a craving, or the fact that it's 3:00 p.m. should not send you racing for the vending machine or obsessing about the energy bar in your purse. Ideally, you should put off eating until your stomach is growling and it's difficult to concentrate.  Use it to lose it. When you feel the urge to eat, use the HALT method. Ask yourself, Am I really hungry? Or am I angry or anxious, lonely or bored, or tired? If you're still not certain, try the apple test. If you're truly hungry, an apple should seem delicious; if it doesn't, something else is going on. Or you can try drinking water and making yourself busy, if you are still hungry try a healthy snack.  4. Not All Calories Are Created Equal The mechanics of weight loss are pretty simple: Take in fewer calories than you use for energy. But the kind of food you eat makes all the difference. Processed food that's high in saturated fat and refined starch or sugar can cause inflammation that disrupts the hormone signals that tell your brain you're full. The result: You eat a lot more.  Use it to lose   it. Clean up your diet. Swap in whole, unprocessed foods, including vegetables, lean protein, and healthy fats that will fill you up and give you the biggest nutritional bang for your calorie buck. In a few weeks, as your brain starts receiving regular hunger and fullness signals once again, you'll notice that you feel less hungry overall and naturally start cutting back on the amount  you eat.  5. Protein, Produce, and Plant-Based Fats Are Your Weight-Loss Trinity Here's why eating the three Ps regularly will help you drop pounds. Protein fills you up. You need it to build lean muscle, which keeps your metabolism humming so that you can torch more fat. People in a weight-loss program who ate double the recommended daily allowance for protein (about 110 grams for a 150-pound woman) lost 70 percent of their weight from fat, while people who ate the RDA lost only about 40 percent, one study found. Produce is packed with filling fiber. "It's very difficult to consume too many calories if you're eating a lot of vegetables. Example: Three cups of broccoli is a lot of food, yet only 93 calories. (Fruit is another story. It can be easy to overeat and can contain a lot of calories from sugar, so be sure to monitor your intake.) Plant-based fats like olive oil and those in avocados and nuts are healthy and extra satiating.  Use it to lose it. Aim to incorporate each of the three Ps into every meal and snack. People who eat protein throughout the day are able to keep weight off, according to a study in the American Journal of Clinical Nutrition. In addition to meat, poultry and seafood, good sources are beans, lentils, eggs, tofu, and yogurt. As for fat, keep portion sizes in check by measuring out salad dressing, oil, and nut butters (shoot for one to two tablespoons). Finally, eat veggies or a little fruit at every meal. People who did that consumed 308 fewer calories but didn't feel any hungrier than when they didn't eat more produce.  7. How You Eat Is As Important As What You Eat In order for your brain to register that you're full, you need to focus on what you're eating. Sit down whenever you eat, preferably at a table. Turn off the TV or computer, put down your phone, and look at your food. Smell it. Chew slowly, and don't put another bite on your fork until you swallow. When women ate  lunch this attentively, they consumed 30 percent less when snacking later than those who listened to an audiobook at lunchtime, according to a study in the [employer] Journal of Nutrition. 8. Weighing Yourself Really Works The scale provides the best evidence about whether your efforts are paying off. Seeing the numbers tick up or down or stagnate is motivation to keep going-or to rethink your approach. A 2015 study at [employer] found that daily weigh-ins helped people lose more weight, keep it off, and maintain that loss, even after two years. Use it to lose it. Step on the scale at the same time every day for the best results. If your weight shoots up several pounds from one weigh-in to the next, don't freak out. Eating a lot of salt the night before or having your period is the likely culprit. The number should return to normal in a day or two. It's a steady climb that you need to do something about. 9. Too Much Stress and Too Little Sleep Are Your Enemies When you're tired and frazzled,   your body cranks up the production of cortisol, the stress hormone that can cause carb cravings. Not getting enough sleep also boosts your levels of ghrelin, a hormone associated with hunger, while suppressing leptin, a hormone that signals fullness and satiety. People on a diet who slept only five and a half hours a night for two weeks lost 55 percent less fat and were hungrier than those who slept eight and a half hours, according to a study in the [employer] Journal. Use it to lose it. Prioritize sleep, aiming for seven hours or more a night, which research shows helps lower stress. And make sure you're getting quality zzz's. If a snoring spouse or a fidgety cat wakes you up frequently throughout the night, you may end up getting the equivalent of just four hours of sleep, according to a study from [employer]. Keep pets out of the bedroom, and use a white-noise app to drown out  snoring. 10. You Will Hit a plateau-And You Can Bust Through It As you slim down, your body releases much less leptin, the fullness hormone.  If you're not strength training, start right now. Building muscle can raise your metabolism to help you overcome a plateau. To keep your body challenged and burning calories, incorporate new moves and more intense intervals into your workouts or add another sweat session to your weekly routine. Alternatively, cut an extra 100 calories or so a day from your diet. Now that you've lost weight, your body simply doesn't need as much fuel.    Since food equals calories, in order to lose weight you must either eat fewer calories, exercise more to burn off calories with activity, or both. Food that is not used to fuel the body is stored as fat. A major component of losing weight is to make smarter food choices. Here's how:  1)   Limit non-nutritious foods, such as: Sugar, honey, syrups and candy Pastries, donuts, pies, cakes and cookies Soft drinks, sweetened juices and alcoholic beverages  2)  Cut down on high-fat foods by: - Choosing poultry, fish or lean red meat - Choosing low-fat cooking methods, such as baking, broiling, steaming, grilling and boiling - Using low-fat or non-fat dairy products - Using vinaigrette, herbs, lemon or fat-free salad dressings - Avoiding fatty meats, such as bacon, sausage, franks, ribs and luncheon meats - Avoiding high-fat snacks like nuts, chips and chocolate - Avoiding fried foods - Using less butter, margarine, oil and mayonnaise - Avoiding high-fat gravies, cream sauces and cream-based soups  3) Eat a variety of foods, including: - Fruit and vegetables that are raw, steamed or baked - Whole grains, breads, cereal, rice and pasta - Dairy products, such as low-fat or non-fat milk or yogurt, low-fat cottage cheese and low-fat cheese - Protein-rich foods like chicken, turkey, fish, lean meat and legumes, or beans  4)  Change your eating habits by: - Eat three balanced meals a day to help control your hunger - Watch portion sizes and eat small servings of a variety of foods - Choose low-calorie snacks - Eat only when you are hungry and stop when you are satisfied - Eat slowly and try not to perform other tasks while eating - Find other activities to distract you from food, such as walking, taking up a hobby or being involved in the community - Include regular exercise in your daily routine ( minimum of 20 min of moderate-intensity exercise at least 5 days/week)  - Find a support group,   if necessary, for emotional support in your weight loss journey           Easy ways to cut 100 calories   1. Eat your eggs with hot sauce OR salsa instead of cheese.  Eggs are great for breakfast, but many people consider eggs and cheese to be BFFs. Instead of cheese-1 oz. of cheddar has 114 calories-top your eggs with hot sauce, which contains no calories and helps with satiety and metabolism. Salsa is also a great option!!  2. Top your toast, waffles or pancakes with fresh berries instead of jelly or syrup. Half a cup of berries-fresh, frozen or thawed-has about 40 calories, compared with 2 tbsp. of maple syrup or jelly, which both have about 100 calories. The berries will also give you a good punch of fiber, which helps keep you full and satisfied and won't spike blood sugar quickly like the jelly or syrup. 3. Swap the non-fat latte for black coffee with a splash of half-and-half. Contrary to its name, that non-fat latte has 130 calories and a startling 19g of carbohydrates per 16 oz. serving. Replacing that 'light' drinkable dessert with a black coffee with a splash of half-and-half saves you more than 100 calories per 16 oz. serving. 4. Sprinkle salads with freeze-dried raspberries instead of dried cranberries. If you want a sweet addition to your nutritious salad, stay away from dried cranberries. They have a  whopping 130 calories per  cup and 30g carbohydrates. Instead, sprinkle freeze-dried raspberries guilt-free and save more than 100 calories per  cup serving, adding 3g of belly-filling fiber. 5. Go for mustard in place of mayo on your sandwich. Mustard can add really nice flavor to any sandwich, and there are tons of varieties, from spicy to honey. A serving of mayo is 95 calories, versus 10 calories in a serving of mustard.  Or try an avocado mayo spread: You can find the recipe few click this link: https://www.californiaavocado.com/recipes/recipe-container/california-avocado-mayo 6. Choose a DIY salad dressing instead of the store-bought kind. Mix Dijon or whole grain mustard with low-fat Kefir or red wine vinegar and garlic. 7. Use hummus as a spread instead of a dip. Use hummus as a spread on a high-fiber cracker or tortilla with a sandwich and save on calories without sacrificing taste. 8. Pick just one salad "accessory." Salad isn't automatically a calorie winner. It's easy to over-accessorize with toppings. Instead of topping your salad with nuts, avocado and cranberries (all three will clock in at 313 calories), just pick one. The next day, choose a different accessory, which will also keep your salad interesting. You don't wear all your jewelry every day, right? 9. Ditch the white pasta in favor of spaghetti squash. One cup of cooked spaghetti squash has about 40 calories, compared with traditional spaghetti, which comes with more than 200. Spaghetti squash is also nutrient-dense. It's a good source of fiber and Vitamins A and C, and it can be eaten just like you would eat pasta-with a great tomato sauce and turkey meatballs or with pesto, tofu and spinach, for example. 10. Dress up your chili, soups and stews with non-fat Greek yogurt instead of sour cream. Just a 'dollop' of sour cream can set you back 115 calories and a whopping 12g of fat-seven of which are of the artery-clogging  variety. Added bonus: Greek yogurt is packed with muscle-building protein, calcium and B Vitamins. 11. Mash cauliflower instead of mashed potatoes. One cup of traditional mashed potatoes-in all their creamy goodness-has more than 200   calories, compared to mashed cauliflower, which you can typically eat for less than 100 calories per 1 cup serving. Cauliflower is a great source of the antioxidant indole-3-carbinol (I3C), which may help reduce the risk of some cancers, like breast cancer. 12. Ditch the ice cream sundae in favor of a Greek yogurt parfait. Instead of a cup of ice cream or fro-yo for dessert, try 1 cup of nonfat Greek yogurt topped with fresh berries and a sprinkle of cacao nibs. Both toppings are packed with antioxidants, which can help reduce cellular inflammation and oxidative damage. And the comparison is a no-brainer: One cup of ice cream has about 275 calories; one cup of frozen yogurt has about 230; and a cup of Greek yogurt has just 130, plus twice the protein, so you're less likely to return to the freezer for a second helping. 13. Put olive oil in a spray container instead of using it directly from the bottle. Each tablespoon of olive oil is 120 calories and 15g of fat. Use a mister instead of pouring it straight into the pan or onto a salad. This allows for portion control and will save you more than 100 calories. 14. When baking, substitute canned pumpkin for butter or oil. Canned pumpkin-not pumpkin pie mix-is loaded with Vitamin A, which is important for skin and eye health, as well as immunity. And the comparisons are pretty crazy:  cup of canned pumpkin has about 40 calories, compared to butter or oil, which has more than 800 calories. Yes, 800 calories. Applesauce and mashed banana can also serve as good substitutions for butter or oil, usually in a 1:1 ratio. 15. Top casseroles with high-fiber cereal instead of breadcrumbs. Breadcrumbs are typically made with white bread,  while breakfast cereals contain 5-9g of fiber per serving. Not only will you save more than 150 calories per  cup serving, the swap will also keep you more full and you'll get a metabolism boost from the added fiber. 16. Snack on pistachios instead of macadamia nuts. Believe it or not, you get the same amount of calories from 35 pistachios (100 calories) as you would from only five macadamia nuts. 17. Chow down on kale chips rather than potato chips. This is my favorite 'don't knock it 'till you try it' swap. Kale chips are so easy to make at home, and you can spice them up with a little grated parmesan or chili powder. Plus, they're a mere fraction of the calories of potato chips, but with the same crunch factor we crave so often. 18. Add seltzer and some fruit slices to your cocktail instead of soda or fruit juice. One cup of soda or fruit juice can pack on as much as 140 calories. Instead, use seltzer and fruit slices. The fruit provides valuable phytochemicals, such as flavonoids and anthocyanins, which help to combat cancer and stave off the aging process.       

## 2019-12-05 NOTE — Progress Notes (Signed)
Established Patient Office Visit  Subjective:  Patient ID: Kathleen Ramos, female    DOB: [birth year]  Age: 44 y.o. MRN: [id number]  CC:  Chief Complaint  Patient presents with  . Weight Loss    HPI [name] presents for weight loss discussion. Pt states she has done her research on options available and doesn't want to pursue anything with surgery at this time. She would prefer medication therapy and would be interested in Phentermine. Reports she had lost about 30 pounds in the past with lifestyle modifications- exercise and healthier eating habits. She doesn't have a good relationship with her daughter which has caused increased stress and "stress eats." Her ultimate weight goal is 200 pounds. States her mood is due to a recent disagreement she had with her daughter. She used to see a therapist in the past and is planning to reach out to restart sessions. Reports good support at home- husband. Denies SI/HI.  Past Medical History:  Diagnosis Date  . Anemia     Past Surgical History:  Procedure Laterality Date  . FOOT SURGERY      Family History  Problem Relation Age of Onset  . Diabetes Mother   . Hyperlipidemia Mother   . Hypertension Mother   . Diabetes Father   . Stroke Father   . Healthy Sister   . Diabetes Brother   . Stroke Brother   . Seizures Brother   . Healthy Brother   . Diabetes Maternal Grandmother     Social History   Socioeconomic History  . Marital status: Married    Spouse name: Not on file  . Number of children: Not on file  . Years of education: Not on file  . Highest education level: Not on file  Occupational History  . Not on file  Tobacco Use  . Smoking status: Never Smoker  . Smokeless tobacco: Never Used  Substance and Sexual Activity  . Alcohol use: Yes    Comment: occassionally  . Drug use: Never  . Sexual activity: Yes    Birth control/protection: None  Other Topics Concern  . Not on file  Social History Narrative    ** Merged History Encounter **       Social Determinants of Health   Financial Resource Strain:   . Difficulty of Paying Living Expenses:   Food Insecurity:   . Worried About Charity fundraiser in the Last Year:   . Arboriculturist in the Last Year:   Transportation Needs:   . Film/video editor (Medical):   Marland Kitchen Lack of Transportation (Non-Medical):   Physical Activity:   . Days of Exercise per Week:   . Minutes of Exercise per Session:   Stress:   . Feeling of Stress :   Social Connections:   . Frequency of Communication with Friends and Family:   . Frequency of Social Gatherings with Friends and Family:   . Attends Religious Services:   . Active Member of Clubs or Organizations:   . Attends Archivist Meetings:   Marland Kitchen Marital Status:   Intimate Partner Violence:   . Fear of Current or Ex-Partner:   . Emotionally Abused:   Marland Kitchen Physically Abused:   . Sexually Abused:     Outpatient Medications Prior to Visit  Medication Sig Dispense Refill  . B Complex-C-Folic Acid (B COMPLEX-VITAMIN C-FOLIC ACID) 1 MG tablet Take 1 tablet by mouth daily with breakfast. 90 tablet 1  . Vitamin D,  Ergocalciferol, (DRISDOL) 1.25 MG (50000 UNIT) CAPS capsule Take 1 capsule (50,000 Units total) by mouth every 7 (seven) days. 4 capsule 0   No facility-administered medications prior to visit.    No Known Allergies  ROS Review of Systems  Review of Systems: General: Denies fever, chills, unexplained weight loss.  Optho/Auditory:Denies visual changes, blurred vision/LOV Respiratory: Denies SOB, DOE, cough.  Cardiovascular: Denies chest pain, palpitations, new onset peripheral edema  Gastrointestinal: Denies nausea, vomiting, diarrhea.  Genitourinary: Denies dysuria, freq/ urgency, flank pain   Endocrine: Denies hot or cold intolerance, polyuria, polydipsia. Musculoskeletal: Denies unexplained myalgias, joint swelling, unexplained arthralgias, gait problems.  Skin:  Denies rash,  suspicious lesions Neurological:  Denies dizziness, unexplained weakness, numbness  Psychiatric/Behavioral:  Denies suicidal or homicidal ideations, hallucinations   Objective:    Physical Exam General: In no apparent distress, appears stated age. Eyes: PERRLA, EOMs, conjunctiva clr Resp: Respiratory effort- normal, ECTA B/L w/o W/R/R  Cardio: RRR w/o MRGs. Abdomen: no gross distention. Lymphatics:  less 2 sec cap RF M-sk: Full ROM, 5/5 strength, normal gait.  Skin: Warm, dry  Neuro: Alert, Oriented Psych: Normal affect, Insight and Judgment appropriate.   BP 127/74   Pulse 78   Temp 98.3 F (36.8 C) (Oral)   Ht 5' 7.5" (1.715 m)   Wt 283 lb (128.4 kg)   LMP 12/02/2019 (Exact Date)   SpO2 97% Comment: on RA  BMI 43.67 kg/m  Wt Readings from Last 3 Encounters:  12/05/19 283 lb (128.4 kg)  08/23/19 284 lb 14.4 oz (129.2 kg)  07/07/19 277 lb 3.2 oz (125.7 kg)     Health Maintenance Due  Topic Date Due  . COVID-19 Vaccine (1) Never done    There are no preventive care reminders to display for this patient.  Lab Results  Component Value Date   TSH 0.963 05/23/2019   Lab Results  Component Value Date   WBC 6.6 05/23/2019   HGB 11.9 05/23/2019   HCT 36.7 05/23/2019   MCV 82 05/23/2019   PLT 246 05/23/2019   Lab Results  Component Value Date   NA 139 12/05/2019   K 4.0 12/05/2019   CO2 22 12/05/2019   GLUCOSE 106 (H) 12/05/2019   BUN 10 12/05/2019   CREATININE 0.59 12/05/2019   BILITOT <0.2 12/05/2019   ALKPHOS 67 12/05/2019   AST 15 12/05/2019   ALT 13 12/05/2019   PROT 7.0 12/05/2019   ALBUMIN 3.8 12/05/2019   CALCIUM 9.1 12/05/2019   Lab Results  Component Value Date   CHOL 206 (H) 05/23/2019   Lab Results  Component Value Date   HDL 58 05/23/2019   Lab Results  Component Value Date   LDLCALC 134 (H) 05/23/2019   Lab Results  Component Value Date   TRIG 77 05/23/2019   Lab Results  Component Value Date   CHOLHDL 3.6 05/23/2019    Lab Results  Component Value Date   HGBA1C 5.6 05/23/2019      Assessment & Plan:   Problem List Items Addressed This Visit      Other   Morbid obesity ([place])   Relevant Medications   phentermine 15 MG capsule   BMI 40.0-44.9, adult ([place]) - Primary   Relevant Medications   phentermine 15 MG capsule    Other Visit Diagnoses    Weight loss counseling, encounter for       Relevant Medications   phentermine 15 MG capsule   Medication monitoring encounter  Relevant Orders   Comp Met (CMET) (Completed)      Meds ordered this encounter  Medications  . phentermine 15 MG capsule    Sig: Take 1 capsule (15 mg total) by mouth every morning.    Dispense:  30 capsule    Refill:  2    Order Specific Question:   Supervising Provider    Answer:   Beatrice Lecher D [[id number]]   Morbid obesity, Weight loss: - Discussed with patient risks and benefits of medication and intended use is short term- 12 weeks. Pt verbalized understanding and all questions were answered.  - Provided patient information on behavior modifications as adjunct therapy to medication.  - Will obtain baseline CMP to monitor liver and kidney function. - Pt has no med history of CAD, hyperthyroidism, or uncontrolled blood pressure.  - Will start Phentermine 15 mg once daily.  - Will place order to recheck CMP in 4 weeks and pt will follow-up by [facility] or call office in 4 weeks if 15 mg dose not very effective and will consider increasing dose to 37.5 mg.  Mood, Depression: - PHQ9 score of 11 and denies SI/HI. - Encouraged to contact previous therapist and schedule an appointment. Managing stress will also help control emotional eating. Pt declined medication at this time. - Will continue to monitor.   Follow-up: Return in about 3 months (around 03/06/2020) for weight loss management, 4 weeks for lab only (CMP).    Lorrene Reid, PA-C

## 2019-12-06 LAB — COMPREHENSIVE METABOLIC PANEL
ALT: 13 IU/L (ref 0–32)
AST: 15 IU/L (ref 0–40)
Albumin/Globulin Ratio: 1.2 (ref 1.2–2.2)
Albumin: 3.8 g/dL (ref 3.8–4.8)
Alkaline Phosphatase: 67 IU/L (ref 39–117)
BUN/Creatinine Ratio: 17 (ref 9–23)
BUN: 10 mg/dL (ref 6–24)
Bilirubin Total: <0.2 mg/dL (ref 0.0–1.2)
CO2: 22 mmol/L (ref 20–29)
Calcium: 9.1 mg/dL (ref 8.7–10.2)
Chloride: 105 mmol/L (ref 96–106)
Creatinine, Ser: 0.59 mg/dL (ref 0.57–1.00)
GFR calc Af Amer: 132 mL/min/{1.73_m2} (ref >59)
GFR calc non Af Amer: 114 mL/min/{1.73_m2} (ref >59)
Globulin, Total: 3.2 g/dL (ref 1.5–4.5)
Glucose: 106 mg/dL — ABNORMAL HIGH (ref 65–99)
Potassium: 4 mmol/L (ref 3.5–5.2)
Sodium: 139 mmol/L (ref 134–144)
Total Protein: 7 g/dL (ref 6.0–8.5)

## 2019-12-12 ENCOUNTER — Encounter: Payer: Self-pay | Admitting: Physician Assistant

## 2019-12-12 MED ORDER — PHENTERMINE HCL 37.5 MG PO CAPS: 37.5000 mg | ORAL_CAPSULE | ORAL | 0 refills | Status: DC

## 2019-12-12 NOTE — Telephone Encounter (Signed)
Please approve dose increase if deemed appropriate. AS, CMA

## 2020-07-02 ENCOUNTER — Encounter: Payer: Self-pay | Admitting: Physician Assistant

## 2020-07-25 ENCOUNTER — Ambulatory Visit (INDEPENDENT_AMBULATORY_CARE_PROVIDER_SITE_OTHER): Payer: Managed Care, Other (non HMO) | Admitting: Physician Assistant

## 2020-07-25 ENCOUNTER — Other Ambulatory Visit: Payer: Self-pay

## 2020-07-25 ENCOUNTER — Encounter: Payer: Self-pay | Admitting: Physician Assistant

## 2020-07-25 VITALS — BP 101/67 | HR 95 | Ht 67.5 in | Wt 288.1 lb

## 2020-07-25 DIAGNOSIS — Z713 Dietary counseling and surveillance: Secondary | ICD-10-CM

## 2020-07-25 DIAGNOSIS — F321 Major depressive disorder, single episode, moderate: Secondary | ICD-10-CM | POA: Diagnosis not present

## 2020-07-25 DIAGNOSIS — E7800 Pure hypercholesterolemia, unspecified: Secondary | ICD-10-CM | POA: Diagnosis not present

## 2020-07-25 DIAGNOSIS — Z6841 Body Mass Index (BMI) 40.0 and over, adult: Secondary | ICD-10-CM | POA: Diagnosis not present

## 2020-07-25 NOTE — Progress Notes (Signed)
Established Patient Office Visit  Subjective:  Patient ID: Kathleen Ramos, female    DOB: 1976/01/24  Age: 44 y.o. MRN: [id number]  CC:  Chief Complaint  Patient presents with   Weight Check   Depression    HPI Kathleen Ramos presents for follow up on mood management and weight.  Patient was started on phentermine back in May and reports was doing well with losing weight.  Was taking medication without issues and made dietary changes by reducing calories, had a personal trainer twice a week and was going for daily walks for at least an hour.  However has been experiencing increased emotional stress with family issues and reports is an emotional eater.  In the past has tried intermittent fasting (08/2019), low calorie diet (11/2019), and exercise for weight loss.  Is considering bariatric surgery and has an upcoming consultation in February 2022.  Denies chest pain, palpitations, syncope, dizziness or headache.  No family history of heart disease, MI, or CVA.  No history of surgical complications.  Is in the process of trying to get established with a therapist within her insurance network.  Not interested on antidepressant medications.  States has a good support system at home.  States wonders if possibly has PCOS due to obesity and facial hairs especially under the chin.  Patient does have an OB/GYN.  Past Medical History:  Diagnosis Date   Anemia     Past Surgical History:  Procedure Laterality Date   FOOT SURGERY      Family History  Problem Relation Age of Onset   Diabetes Mother    Hyperlipidemia Mother    Hypertension Mother    Diabetes Father    Stroke Father    Healthy Sister    Diabetes Brother    Stroke Brother    Seizures Brother    Healthy Brother    Diabetes Maternal Grandmother     Social History   Socioeconomic History   Marital status: Married    Spouse name: Not on file   Number of children: Not on file   Years of education: Not on  file   Highest education level: Not on file  Occupational History   Not on file  Tobacco Use   Smoking status: Never Smoker   Smokeless tobacco: Never Used  Building services engineer Use: Never used  Substance and Sexual Activity   Alcohol use: Yes    Comment: occassionally   Drug use: Never   Sexual activity: Yes    Birth control/protection: None  Other Topics Concern   Not on file  Social History Narrative   ** Merged History Encounter **       Social Determinants of Health   Financial Resource Strain: Not on file  Food Insecurity: Not on file  Transportation Needs: Not on file  Physical Activity: Not on file  Stress: Not on file  Social Connections: Not on file  Intimate Partner Violence: Not on file    Outpatient Medications Prior to Visit  Medication Sig Dispense Refill   B Complex-C-Folic Acid (B COMPLEX-VITAMIN C-FOLIC ACID) 1 MG tablet Take 1 tablet by mouth daily with breakfast. 90 tablet 1   phentermine 37.5 MG capsule Take 1 capsule (37.5 mg total) by mouth every morning. 90 capsule 0   Vitamin D, Ergocalciferol, (DRISDOL) 1.25 MG (50000 UNIT) CAPS capsule Take 1 capsule (50,000 Units total) by mouth every 7 (seven) days. (Patient not taking: Reported on 07/25/2020) 4 capsule 0  No facility-administered medications prior to visit.    No Known Allergies  ROS Review of Systems  A fourteen system review of systems was performed and found to be positive as per HPI.  Objective:    Physical Exam Constitutional:      General: She is not in acute distress.    Appearance: She is obese. She is not toxic-appearing or diaphoretic.  HENT:     Head: Normocephalic and atraumatic.     Right Ear: Tympanic membrane, ear canal and external ear normal.     Left Ear: Tympanic membrane, ear canal and external ear normal.     Nose: Nose normal.     Mouth/Throat:     Mouth: Mucous membranes are moist.     Pharynx: No oropharyngeal exudate or posterior  oropharyngeal erythema.  Eyes:     General:        Right eye: No discharge.        Left eye: No discharge.     Extraocular Movements: Extraocular movements intact.     Conjunctiva/sclera: Conjunctivae normal.     Pupils: Pupils are equal, round, and reactive to light.  Cardiovascular:     Rate and Rhythm: Normal rate and regular rhythm.     Pulses: Normal pulses.     Heart sounds: Normal heart sounds. No murmur heard.   Pulmonary:     Effort: Pulmonary effort is normal. No respiratory distress.     Breath sounds: Normal breath sounds. No wheezing.  Abdominal:     General: Bowel sounds are normal. There is no distension.     Palpations: Abdomen is soft. There is no mass.     Tenderness: There is no abdominal tenderness.  Musculoskeletal:        General: Normal range of motion.     Cervical back: Normal range of motion and neck supple. No rigidity or tenderness.  Skin:    General: Skin is warm and dry.     Findings: No bruising, erythema or rash.  Neurological:     General: No focal deficit present.     Mental Status: She is alert.     Motor: No weakness.     Gait: Gait normal.  Psychiatric:        Mood and Affect: Mood normal.        Behavior: Behavior normal.        Thought Content: Thought content normal.        Judgment: Judgment normal.     BP 101/67    Pulse 95    Ht 5' 7.5" (1.715 m)    Wt 288 lb 1.6 oz (130.7 kg)    LMP  (LMP Unknown)    SpO2 97%    BMI 44.46 kg/m  Wt Readings from Last 3 Encounters:  07/25/20 288 lb 1.6 oz (130.7 kg)  12/05/19 283 lb (128.4 kg)  08/23/19 284 lb 14.4 oz (129.2 kg)     Health Maintenance Due  Topic Date Due   INFLUENZA VACCINE  03/04/2020    There are no preventive care reminders to display for this patient.  Lab Results  Component Value Date   TSH 0.963 05/23/2019   Lab Results  Component Value Date   WBC 6.6 05/23/2019   HGB 11.9 05/23/2019   HCT 36.7 05/23/2019   MCV 82 05/23/2019   PLT 246 05/23/2019   Lab  Results  Component Value Date   NA 139 12/05/2019   K 4.0 12/05/2019   CO2 22  12/05/2019   GLUCOSE 106 (H) 12/05/2019   BUN 10 12/05/2019   CREATININE 0.59 12/05/2019   BILITOT <0.2 12/05/2019   ALKPHOS 67 12/05/2019   AST 15 12/05/2019   ALT 13 12/05/2019   PROT 7.0 12/05/2019   ALBUMIN 3.8 12/05/2019   CALCIUM 9.1 12/05/2019   Lab Results  Component Value Date   CHOL 206 (H) 05/23/2019   Lab Results  Component Value Date   HDL 58 05/23/2019   Lab Results  Component Value Date   LDLCALC 134 (H) 05/23/2019   Lab Results  Component Value Date   TRIG 77 05/23/2019   Lab Results  Component Value Date   CHOLHDL 3.6 05/23/2019   Lab Results  Component Value Date   HGBA1C 5.6 05/23/2019      Assessment & Plan:   Problem List Items Addressed This Visit      Other   BMI 40.0-44.9, adult ([facility])    Other Visit Diagnoses    Current moderate episode of major depressive disorder, unspecified whether recurrent ([facility])    -  Primary   Weight loss counseling, encounter for       Elevated LDL cholesterol level         BMI 40.0-44.9: -Patient has gained approximately 5 pounds since last visit, current BMI 44.46. Has tried dietary and lifestyle changes with minimal success and is considering bariatric surgery which is reasonable.  Weight loss will help improve existing comorbidities including hyperlipidemia and mood. -Encourage to continue with weight loss efforts and discussed alternative management options available such as referral to [facility], long term weight loss medications and weight loss programs such as Weight Watcher's.  -Vital signs and physical exam are wnl -Advised to schedule lab visit in 1-4 weeks for fasting blood work including Vit D.   Current moderate episode of major depressive disorder, unspecified whether recurrent: -PHQ-9 score of 14, denies SI/HI. -Patient prefers to get established with a therapist and not interested on psychiatry  or medication therapy at this time. -Encourage to continue to use good support system at home. -Recommend to incorporate nonpharmacologic therapy such as mindfulness therapy and exercise.  -Will continue to monitor. Depression screen [facility] 2/9 07/25/2020 12/05/2019 08/23/2019 05/23/2019  Decreased Interest 2 0 0 0  Down, Depressed, Hopeless 2 2 0 0  PHQ - 2 Score 4 2 0 0  Altered sleeping 3 3 0 0  Tired, decreased energy 2 2 2 3   Change in appetite 3 2 1 3   Feeling bad or failure about yourself  2 2 0 0  Trouble concentrating 0 0 0 0  Moving slowly or fidgety/restless 0 0 0 0  Suicidal thoughts 0 0 0 0  PHQ-9 Score 14 11 3 6   Difficult doing work/chores Not difficult at all Somewhat difficult Not difficult at all Not difficult at all   Of note, advised to discuss with OB/GYN PCOS concerns. On exam patient does have signs of hirsutism.   No orders of the defined types were placed in this encounter.   Follow-up: Return in about 3 months (around 10/23/2020) for Mood, Wt and FBW in 1-4 weeks.   Note:  This note was prepared with assistance of Dragon voice recognition software. Occasional wrong-word or sound-a-like substitutions may have occurred due to the inherent limitations of voice recognition software.   , PA-C

## 2020-07-25 NOTE — Patient Instructions (Signed)

## 2020-08-21 ENCOUNTER — Encounter: Payer: Self-pay | Admitting: Physician Assistant

## 2020-08-24 ENCOUNTER — Other Ambulatory Visit: Payer: Self-pay

## 2020-08-24 ENCOUNTER — Ambulatory Visit
Admission: EM | Admit: 2020-08-24 | Discharge: 2020-08-24 | Disposition: A | Discharge status: Home / Self Care | Payer: Managed Care, Other (non HMO) | Attending: Physician Assistant | Admitting: Physician Assistant

## 2020-08-24 ENCOUNTER — Ambulatory Visit (INDEPENDENT_AMBULATORY_CARE_PROVIDER_SITE_OTHER): Payer: Managed Care, Other (non HMO)

## 2020-08-24 DIAGNOSIS — W19XXXA Unspecified fall, initial encounter: Secondary | ICD-10-CM | POA: Diagnosis not present

## 2020-08-24 DIAGNOSIS — M86661 Other chronic osteomyelitis, right tibia and fibula: Secondary | ICD-10-CM

## 2020-08-24 DIAGNOSIS — M25571 Pain in right ankle and joints of right foot: Secondary | ICD-10-CM | POA: Diagnosis not present

## 2020-08-24 DIAGNOSIS — S93401A Sprain of unspecified ligament of right ankle, initial encounter: Secondary | ICD-10-CM

## 2020-08-24 NOTE — ED Provider Notes (Signed)
[facility]    CSN: [id number] Arrival date & time: 08/24/20  1253      History   Chief Complaint Chief Complaint  Patient presents with   Ankle Pain    Right ankle   Fall    Occurred x 2 hours ago    HPI Kathleen Ramos is a 45 y.o. female.   The history is provided by the patient. No language interpreter was used.  Ankle Pain Location:  Ankle Pain details:    Quality:  Aching   Severity:  Moderate   Timing:  Constant   Progression:  Worsening Chronicity:  New Relieved by:  Nothing Worsened by:  Nothing Ineffective treatments:  None tried Associated symptoms: no decreased ROM   Fall    Past Medical History:  Diagnosis Date   Anemia     Patient Active Problem List   Diagnosis Date Noted   Vitamin D deficiency 08/23/2019   Non-restorative sleep 06/14/2019   Excessive daytime sleepiness 06/14/2019   Inadequate sleep hygiene 06/14/2019   Sleep deprivation 06/14/2019   Healthcare maintenance 05/23/2019   BMI 40.0-44.9, adult (HCC) 05/23/2019   Snoring 05/23/2019   Family history of diabetes mellitus in first degree relative 10/01/2016   Other fatigue 10/01/2016   Routine physical examination 10/01/2016   Morbid obesity (HCC) 10/01/2016   Right ankle pain 10/01/2016    Past Surgical History:  Procedure Laterality Date   FOOT SURGERY      OB History    Gravida  1   Para  1   Term  1   Preterm      AB      Living  1     SAB      IAB      Ectopic      Multiple      Live Births  1            Home Medications    Prior to Admission medications   Medication Sig Start Date End Date Taking? Authorizing Provider  B Complex-C-Folic Acid (B COMPLEX-VITAMIN C-FOLIC ACID) 1 MG tablet Take 1 tablet by mouth daily with breakfast. 06/14/19  Yes Dohmeier, Porfirio Mylar, MD  Vitamin D, Ergocalciferol, (DRISDOL) 1.25 MG (50000 UNIT) CAPS capsule Take 1 capsule (50,000 Units total) by mouth every 7 (seven) days.  09/29/19  Yes Opalski, Gavin Pound, DO    Family History Family History  Problem Relation Age of Onset   Diabetes Mother    Hyperlipidemia Mother    Hypertension Mother    Diabetes Father    Stroke Father    Healthy Sister    Diabetes Brother    Stroke Brother    Seizures Brother    Healthy Brother    Diabetes Maternal Grandmother     Social History Social History   Tobacco Use   Smoking status: Never Smoker   Smokeless tobacco: Never Used  Building services engineer Use: Never used  Substance Use Topics   Alcohol use: Yes    Comment: occassionally   Drug use: Never     Allergies   Patient has no known allergies.   Review of Systems Review of Systems  Musculoskeletal: Positive for joint swelling and myalgias.  All other systems reviewed and are negative.    Physical Exam Triage Vital Signs ED Triage Vitals  Enc Vitals Group     BP 08/24/20 1302 110/74     Pulse Rate 08/24/20 1302 93     Resp 08/24/20  1302 18     Temp 08/24/20 1302 98.1 F (36.7 C)     Temp Source 08/24/20 1302 Oral     SpO2 08/24/20 1302 97 %     Weight --      Height --      Head Circumference --      Peak Flow --      Pain Score 08/24/20 1303 8     Pain Loc --      Pain Edu? --      Excl. in GC? --    No data found.  Updated Vital Signs BP 110/74 (BP Location: Left Arm)    Pulse 93    Temp 98.1 F (36.7 C) (Oral)    Resp 18    LMP  (LMP Unknown)    SpO2 97%   Visual Acuity Right Eye Distance:   Left Eye Distance:   Bilateral Distance:    Right Eye Near:   Left Eye Near:    Bilateral Near:     Physical Exam Vitals and nursing note reviewed.  Constitutional:      Appearance: She is well-developed and well-nourished.  HENT:     Head: Normocephalic.  Eyes:     Extraocular Movements: EOM normal.     Pupils: Pupils are equal, round, and reactive to light.  Pulmonary:     Effort: Pulmonary effort is normal.  Abdominal:     General: There is no distension.   Musculoskeletal:        General: Normal range of motion.     Cervical back: Normal range of motion.  Neurological:     General: No focal deficit present.     Mental Status: She is alert and oriented to person, place, and time.  Psychiatric:        Mood and Affect: Mood and affect and mood normal.      UC Treatments / Results  Labs (all labs ordered are listed, but only abnormal results are displayed) Labs Reviewed - No data to display  EKG   Radiology DG Tibia/Fibula Right  Result Date: 08/24/2020 CLINICAL DATA:  Fall, pain EXAM: RIGHT TIBIA AND FIBULA - 2 VIEW COMPARISON:  08/24/2020 FINDINGS: Right tibia and fibula appear intact. No acute osseous finding, fracture or malalignment. Remote right ankle subtalar fusion. IMPRESSION: Previous ankle fusion. No acute osseous finding by plain radiography. Electronically Signed   By: Judie Petit.  Shick M.D.   On: 08/24/2020 13:52   DG Ankle Complete Right  Result Date: 08/24/2020 CLINICAL DATA:  Fall, pain EXAM: RIGHT ANKLE - COMPLETE 3+ VIEW COMPARISON:  09/16/2016 FINDINGS: Remote subtalar joint effusion with hardware noted. Slight lower ankle and foot soft tissue swelling. Mild degenerative arthropathy of the right ankle joint without acute osseous finding or fracture. No large effusion or malalignment. IMPRESSION: Postop changes as above. No acute osseous finding by plain radiography. Electronically Signed   By: Judie Petit.  Shick M.D.   On: 08/24/2020 13:50    Procedures Procedures (including critical care time)  Medications Ordered in UC Medications - No data to display  Initial Impression / Assessment and Plan / UC Course  I have reviewed the triage vital signs and the nursing notes.  Pertinent labs & imaging results that were available during my care of the patient were reviewed by me and considered in my medical decision making (see chart for details).     MDM:  Xray no fracture.  Pt placed in an ace wrap.  Pt has a boot  at home she can  wear.   Final Clinical Impressions(s) / UC Diagnoses   Final diagnoses:  Moderate ankle sprain, right, initial encounter     Discharge Instructions     Return if any problems.    ED Prescriptions    None     PDMP not reviewed this encounter.  An After Visit Summary was printed and given to the patient.   Elson Areas, [place] 08/24/20 1451

## 2020-08-24 NOTE — Discharge Instructions (Addendum)
Return if any problems.

## 2020-08-24 NOTE — ED Triage Notes (Signed)
Patient states she fell on ice about 2 hours ago and has right ankle and lower leg pain since the incident. Pt is oax4 and ambulates with a limp.

## 2020-09-06 ENCOUNTER — Other Ambulatory Visit: Payer: Self-pay | Admitting: Surgery

## 2020-09-06 ENCOUNTER — Other Ambulatory Visit (HOSPITAL_COMMUNITY): Payer: Self-pay | Admitting: Surgery

## 2020-09-17 ENCOUNTER — Other Ambulatory Visit: Payer: Self-pay

## 2020-09-17 ENCOUNTER — Ambulatory Visit (HOSPITAL_COMMUNITY)
Admission: RE | Admit: 2020-09-17 | Discharge: 2020-09-17 | Disposition: A | Discharge status: Home / Self Care | Payer: Managed Care, Other (non HMO) | Source: Ambulatory Visit | Attending: Surgery | Admitting: Surgery

## 2020-10-10 ENCOUNTER — Ambulatory Visit: Payer: Managed Care, Other (non HMO) | Admitting: Skilled Nursing Facility1

## 2020-11-12 ENCOUNTER — Other Ambulatory Visit: Payer: Self-pay

## 2020-11-12 ENCOUNTER — Encounter: Payer: Managed Care, Other (non HMO) | Attending: Surgery | Admitting: Skilled Nursing Facility1

## 2020-11-12 ENCOUNTER — Encounter: Payer: Self-pay | Admitting: Skilled Nursing Facility1

## 2020-11-12 NOTE — Progress Notes (Signed)
Nutrition Assessment for Bariatric Surgery Medical Nutrition Therapy Appt Start Time: 7:43 End Time: 8:45  Patient was seen on 11/12/2020 for Pre-Operative Nutrition Assessment. Letter of approval faxed to [facility] bariatric surgery program coordinator on 11/12/2020.   Referral stated Supervised Weight Loss (SWL) visits needed: 0 Pt will return for a minimum of one visit to complete assessment as pt arrived late preventing completion of education.   Pt is not cleared at this time  Planned surgery: sleeve gastrectomy  Pt expectation of surgery: to not be so tired  Pt expectation of dietitian: none stated    NUTRITION ASSESSMENT   Anthropometrics  Start weight at [facility]: 293 lbs (date: 11/12/2020)  Height: 67 in BMI: 45.89 kg/m2     Clinical  Medical hx: sleep apnea Medications:  Labs:  Notable signs/symptoms: sinus headaches with season changes  Any previous deficiencies? Yes: vitamin D   Micronutrient Nutrition Focused Physical Exam: Hair: No issues observed Eyes: No issues observed Mouth: No issues observed Neck: No issues observed Nails: No issues observed Skin: No issues observed  Lifestyle & Dietary Hx  Pt states she does not wear a C-PAP stating she does not feel comfortable wearing one. Pt reports she does use food to: Filling self with food rather than feeling her feelings. Pt States she cant just tell herself not to eat certain foods she needs to change the relationship around them. Pt states she is going to take "try" out of her vocabulary; pt states she is going to work on Being a pro (conssitant healthy food behaviors). Pt states she is Trying to get her mindset of trying to prepare for making these big changes. Pt states staying on the journey by letting other things go and focusing on her health.  Pt states she already discussed what support looks like with her significant other. Pt states she does wake in the middle of the night and eat stating  she has been working on this but sees where time will be good to be successful with it.   24-Hr Dietary Recall First Meal: skipped Snack: candy Second Meal: eaten out Snack: debbie Third Meal: eaten out Snack:  Beverages: water, sprite   Estimated Energy Needs Calories: 1500   NUTRITION DIAGNOSIS  Overweight/obesity ([place]-3.3) related to past poor dietary habits and physical inactivity as evidenced by patient w/ planned sleeve gastrectomy surgery following dietary guidelines for continued weight loss.    NUTRITION INTERVENTION  Nutrition counseling (C-1) and education (E-2) to facilitate bariatric surgery goals.   Pre-Op Goals Reviewed with the Patient . Track food and beverage intake (pen and paper, MyFitness Pal, Baritastic app, etc.) . Make healthy food choices while monitoring portion sizes . Consume 3 meals per day or try to eat every 3-5 hours . Avoid concentrated sugars and fried foods . Keep sugar & fat in the single digits per serving on food labels . Practice CHEWING your food (aim for applesauce consistency) . Practice not drinking 15 minutes before, during, and 30 minutes after each meal and snack . Avoid all carbonated beverages (ex: soda, sparkling beverages)  . Limit caffeinated beverages (ex: coffee, tea, energy drinks) . Avoid all sugar-sweetened beverages (ex: regular soda, sports drinks)  . Avoid alcohol  . Aim for 64-100 ounces of FLUID daily (with at least half of fluid intake being plain water)  . Aim for at least 60-80 grams of PROTEIN daily . Look for a liquid protein source that contains ?15 g protein and ?5 g carbohydrate (  ex: shakes, drinks, shots) . Make a list of non-food related activities . Physical activity is an important part of a healthy lifestyle so keep it moving! The goal is to reach 150 minutes of exercise per week, including cardiovascular and weight baring activity.  *Goals that are bolded indicate the pt would like to start working  towards these  Handouts Provided Include  . Bariatric Surgery handouts (Nutrition Visits, Pre-Op Goals, Protein Shakes, Vitamins & Minerals)  Learning Style & Readiness for Change Teaching method utilized: Visual & Auditory  Demonstrated degree of understanding via: Teach Back  Readiness Level: contemplative  Barriers to learning/adherence to lifestyle change: none identified at this time  RD's Notes for Next Visit . Assess pts adherence to chosen goals     MONITORING & EVALUATION Dietary intake, weekly physical activity, body weight, and pre-op goals reached at next nutrition visit.    Next Steps  Patient is to follow up at [facility] for Pre-Op Class >2 weeks before surgery for further nutrition education.  Return for next nutrition visit.

## 2020-12-05 ENCOUNTER — Other Ambulatory Visit: Payer: Self-pay

## 2020-12-05 ENCOUNTER — Encounter: Payer: Managed Care, Other (non HMO) | Attending: Surgery | Admitting: Skilled Nursing Facility1

## 2020-12-05 NOTE — Progress Notes (Signed)
Supervised Weight Loss Visit Bariatric Nutrition Education  Planned Surgery: Sleeve gastrectomy   Pt completed visits.   Pt has cleared nutrition requirements.    NUTRITION ASSESSMENT  Anthropometrics  Start weight at [facility]: 293 lbs (date: 11/12/2020)  Weight: 292.1 pounds Height: 67 in BMI: 45.76 kg/m2     Clinical  Medical hx: sleep apnea Medications:  Labs:  Notable signs/symptoms: sinus headaches with season changes  Any previous deficiencies? Yes: vitamin D   Lifestyle & Dietary Hx  Pt states she did complete the mindful meals sheet but did not bring it with her stating she realized in the moment when she was eating out of emotion. Pt state she is feeling better overall stating her family has gotten better.  Pt state she tries to make sure to make more dinners from home throughout the work. Pt states she likes non starchy vegetables.   Pt states it is going to be hard to get rid of the soda.   Estimated daily fluid intake:  oz Supplements:  Current average weekly physical activity: walking around the track 4 days a week 60 minutes  24-Hr Dietary Recall First Meal: water + fruit + yogurt Snack:  Second Meal: cafeteria at work Snack:  Third Meal: eaten out Snack:  Beverages: water, juice, soda  Estimated Energy Needs Calories: 1500   NUTRITION DIAGNOSIS  Overweight/obesity ([place]-3.3) related to past poor dietary habits and physical inactivity as evidenced by patient w/ planned sleeve gastrectomy surgery following dietary guidelines for continued weight loss.   NUTRITION INTERVENTION  Nutrition counseling (C-1) and education (E-2) to facilitate bariatric surgery goals.  Pre-Op Goals Progress & New Goals . Avoid drinking juice: adjust to drinking sugar free . Aim to eat non starchy vegetables 2 times a day 7 days a week . Avoid using butter in your vegetables  . Keep a log of grams and ounces for your foods . Find 3 different flavors of protein shakes you  like  Handouts Provided Include   Detailed MyPlate  Learning Style & Readiness for Change Teaching method utilized: Visual & Auditory  Demonstrated degree of understanding via: Teach Back  Readiness Level: Action Barriers to learning/adherence to lifestyle change: unknown   MONITORING & EVALUATION Dietary intake, weekly physical activity, body weight  Next Steps  Patient is to return to [facility] for pre-op class Pt has completed visits. No further supervised visits required/recomended

## 2020-12-06 ENCOUNTER — Ambulatory Visit: Payer: Managed Care, Other (non HMO) | Admitting: Skilled Nursing Facility1

## 2021-01-10 ENCOUNTER — Encounter: Payer: Self-pay | Admitting: Physician Assistant

## 2021-01-10 ENCOUNTER — Ambulatory Visit (INDEPENDENT_AMBULATORY_CARE_PROVIDER_SITE_OTHER): Payer: Managed Care, Other (non HMO) | Admitting: Physician Assistant

## 2021-01-10 ENCOUNTER — Other Ambulatory Visit: Payer: Self-pay

## 2021-01-10 VITALS — BP 113/73 | HR 70 | Temp 98.2°F | Ht 67.0 in | Wt 288.7 lb

## 2021-01-10 DIAGNOSIS — F321 Major depressive disorder, single episode, moderate: Secondary | ICD-10-CM | POA: Diagnosis not present

## 2021-01-10 DIAGNOSIS — R030 Elevated blood-pressure reading, without diagnosis of hypertension: Secondary | ICD-10-CM | POA: Diagnosis not present

## 2021-01-10 NOTE — Progress Notes (Signed)
Established Patient Office Visit  Subjective:  Patient ID: Kathleen Ramos, female    DOB: 1976/06/22  Age: 45 y.o. MRN: [id number]  CC:  Chief Complaint  Patient presents with   Follow-up    Mood and Weight      HPI Kathleen Ramos presents for follow up on mood and weight. Patient reports had 3 thearpy sessions which she feels were helpful. States her relationship with her daughter has improved and are taking small steps to improve their interaction. Patient is in the process for bariatric surgery and has completed there nutrition counseling and will be attending a support group session. Is working on trying to reduce sweets. Also reports an incident 2 weeks ago where she was not feeling well at work, felt hot and sweaty. Her blood pressure was checked and reports it was high, unable to recall reading. No recurrence of symptoms. Does not have a BP device so has not been checking it at home.  Past Medical History:  Diagnosis Date   Anemia     Past Surgical History:  Procedure Laterality Date   FOOT SURGERY      Family History  Problem Relation Age of Onset   Diabetes Mother    Hyperlipidemia Mother    Hypertension Mother    Diabetes Father    Stroke Father    Healthy Sister    Diabetes Brother    Stroke Brother    Seizures Brother    Healthy Brother    Diabetes Maternal Grandmother     Social History   Socioeconomic History   Marital status: Married    Spouse name: Not on file   Number of children: Not on file   Years of education: Not on file   Highest education level: Not on file  Occupational History   Not on file  Tobacco Use   Smoking status: Never   Smokeless tobacco: Never  Vaping Use   Vaping Use: Never used  Substance and Sexual Activity   Alcohol use: Yes    Comment: occassionally   Drug use: Never   Sexual activity: Yes    Birth control/protection: None  Other Topics Concern   Not on file  Social History Narrative   ** Merged History  Encounter **       Social Determinants of Health   Financial Resource Strain: Not on file  Food Insecurity: Not on file  Transportation Needs: Not on file  Physical Activity: Not on file  Stress: Not on file  Social Connections: Not on file  Intimate Partner Violence: Not on file    Outpatient Medications Prior to Visit  Medication Sig Dispense Refill   B Complex-C-Folic Acid (B COMPLEX-VITAMIN C-FOLIC ACID) 1 MG tablet Take 1 tablet by mouth daily with breakfast. 90 tablet 1   Vitamin D, Ergocalciferol, (DRISDOL) 1.25 MG (50000 UNIT) CAPS capsule Take 1 capsule (50,000 Units total) by mouth every 7 (seven) days. 4 capsule 0   No facility-administered medications prior to visit.    No Known Allergies  ROS Review of Systems A fourteen system review of systems was performed and found to be positive as per HPI.    Objective:    Physical Exam General:  Well Developed, well nourished, in no acute distress  Neuro:  Alert and oriented,  extra-ocular muscles intact  HEENT:  Normocephalic, atraumatic, neck supple Skin:  no gross rash, warm, pink. Cardiac:  RRR, S1 S2 Respiratory:  ECTA B/L, Not using accessory muscles, speaking in  full sentences- unlabored. Vascular:  Ext warm, no cyanosis apprec.; cap RF less 2 sec. No edema  Psych:  No HI/SI, judgement and insight good, Euthymic mood. Full Affect.  BP 113/73   Pulse 70   Temp 98.2 F (36.8 C)   Ht 5\' 7"  (1.702 m)   Wt 288 lb 11.2 oz (131 kg)   SpO2 99%   BMI 45.22 kg/m  Wt Readings from Last 3 Encounters:  01/10/21 288 lb 11.2 oz (131 kg)  12/05/20 292 lb 3.2 oz (132.5 kg)  11/12/20 293 lb (132.9 kg)     Health Maintenance Due  Topic Date Due   COVID-19 Vaccine (3 - Booster for Pfizer series) 04/04/2020    There are no preventive care reminders to display for this patient.  Lab Results  Component Value Date   TSH 0.963 05/23/2019   Lab Results  Component Value Date   WBC 6.6 05/23/2019   HGB 11.9  05/23/2019   HCT 36.7 05/23/2019   MCV 82 05/23/2019   PLT 246 05/23/2019   Lab Results  Component Value Date   NA 139 12/05/2019   K 4.0 12/05/2019   CO2 22 12/05/2019   GLUCOSE 106 (H) 12/05/2019   BUN 10 12/05/2019   CREATININE 0.59 12/05/2019   BILITOT <0.2 12/05/2019   ALKPHOS 67 12/05/2019   AST 15 12/05/2019   ALT 13 12/05/2019   PROT 7.0 12/05/2019   ALBUMIN 3.8 12/05/2019   CALCIUM 9.1 12/05/2019   Lab Results  Component Value Date   CHOL 206 (H) 05/23/2019   Lab Results  Component Value Date   HDL 58 05/23/2019   Lab Results  Component Value Date   LDLCALC 134 (H) 05/23/2019   Lab Results  Component Value Date   TRIG 77 05/23/2019   Lab Results  Component Value Date   CHOLHDL 3.6 05/23/2019   Lab Results  Component Value Date   HGBA1C 5.6 05/23/2019      Assessment & Plan:   Problem List Items Addressed This Visit       Other   Morbid obesity (HCC)   Other Visit Diagnoses     Current moderate episode of major depressive disorder, unspecified whether recurrent (HCC)    -  Primary   Single episode of elevated blood pressure          Morbid obesity: -Associated with hyperlipidemia and vitamin D deficiency. -Patient undergoing Bariatric surgery process. -Recommend to continue with weight loss efforts and dietary changes.   Current moderate episode of major depressive disorder, unspecified whether recurrent: -PHQ-9 score of 5, improved from prior. -Patient completed [facility] therapy sessions. Encourage to resume sessions if mood changes. -Continue to use good support system. Depression screen [facility] 2/9 01/10/2021 12/05/2020 11/12/2020 07/25/2020 12/05/2019  Decreased Interest 0 0 0 2 0  Down, Depressed, Hopeless 0 0 0 2 2  PHQ - 2 Score 0 0 0 4 2  Altered sleeping 2 - - 3 3  Tired, decreased energy 1 - - 2 2  Change in appetite 2 - - 3 2  Feeling bad or failure about yourself  0 - - 2 2  Trouble concentrating 0 - - 0 0  Moving slowly or  fidgety/restless 0 - - 0 0  Suicidal thoughts 0 - - 0 0  PHQ-9 Score 5 - - 14 11  Difficult doing work/chores Not difficult at all - - Not difficult at all Somewhat difficult   Single episode of elevated blood pressure: -  BP and pulse today wnl's. Last office BP also normal (101/67). -Patient asymptomatic and no new episodes so likely situational. Advised to monitor BP/pulse at work if possibly. -Recommend further evaluation if symptoms reoccur.    No orders of the defined types were placed in this encounter.   Follow-up: Return in about 3 months (around 04/12/2021) for CPE and FBW .   Note:  This note was prepared with assistance of Dragon voice recognition software. Occasional wrong-word or sound-a-like substitutions may have occurred due to the inherent limitations of voice recognition software.  Mayer Masker, PA-C

## 2021-01-10 NOTE — Patient Instructions (Signed)
Calorie Counting for Weight Loss Calories are units of energy. Your body needs a certain number of calories from food to keep going throughout the day. When you eat or drink more calories than your body needs, your body stores the extra calories mostly as fat. When you eat or drink fewer calories than your body needs, your body burns fat to get the energy it needs. Calorie counting means keeping track of how many calories you eat and drink each day. Calorie counting can be helpful if you need to lose weight. If you eat fewer calories than your body needs, you should lose weight. Ask your health care provider what a healthy weight is for you. For calorie counting to work, you will need to eat the right number of calories each day to lose a healthy amount of weight per week. A dietitian can help you figure out how many calories you need in a day and will suggest ways to reach your calorie goal.  A healthy amount of weight to lose each week is usually 1-2 lb (0.5-0.9 kg). This usually means that your daily calorie intake should be reduced by 500-750 calories.  Eating 1,200-1,500 calories a day can help most women lose weight.  Eating 1,500-1,800 calories a day can help most men lose weight. What do I need to know about calorie counting? Work with your health care provider or dietitian to determine how many calories you should get each day. To meet your daily calorie goal, you will need to:  Find out how many calories are in each food that you would like to eat. Try to do this before you eat.  Decide how much of the food you plan to eat.  Keep a food log. Do this by writing down what you ate and how many calories it had. To successfully lose weight, it is important to balance calorie counting with a healthy lifestyle that includes regular activity. Where do I find calorie information? The number of calories in a food can be found on a Nutrition Facts label. If a food does not have a Nutrition Facts  label, try to look up the calories online or ask your dietitian for help. Remember that calories are listed per serving. If you choose to have more than one serving of a food, you will have to multiply the calories per serving by the number of servings you plan to eat. For example, the label on a package of bread might say that a serving size is 1 slice and that there are 90 calories in a serving. If you eat 1 slice, you will have eaten 90 calories. If you eat 2 slices, you will have eaten 180 calories.   How do I keep a food log? After each time that you eat, record the following in your food log as soon as possible:  What you ate. Be sure to include toppings, sauces, and other extras on the food.  How much you ate. This can be measured in cups, ounces, or number of items.  How many calories were in each food and drink.  The total number of calories in the food you ate. Keep your food log near you, such as in a pocket-sized notebook or on an app or website on your mobile phone. Some programs will calculate calories for you and show you how many calories you have left to meet your daily goal. What are some portion-control tips?  Know how many calories are in a serving. This will   help you know how many servings you can have of a certain food.  Use a measuring cup to measure serving sizes. You could also try weighing out portions on a kitchen scale. With time, you will be able to estimate serving sizes for some foods.  Take time to put servings of different foods on your favorite plates or in your favorite bowls and cups so you know what a serving looks like.  Try not to eat straight from a food's packaging, such as from a bag or box. Eating straight from the package makes it hard to see how much you are eating and can lead to overeating. Put the amount you would like to eat in a cup or on a plate to make sure you are eating the right portion.  Use smaller plates, glasses, and bowls for smaller  portions and to prevent overeating.  Try not to multitask. For example, avoid watching TV or using your computer while eating. If it is time to eat, sit down at a table and enjoy your food. This will help you recognize when you are full. It will also help you be more mindful of what and how much you are eating. What are tips for following this plan? Reading food labels  Check the calorie count compared with the serving size. The serving size may be smaller than what you are used to eating.  Check the source of the calories. Try to choose foods that are high in protein, fiber, and vitamins, and low in saturated fat, trans fat, and sodium. Shopping  Read nutrition labels while you shop. This will help you make healthy decisions about which foods to buy.  Pay attention to nutrition labels for low-fat or fat-free foods. These foods sometimes have the same number of calories or more calories than the full-fat versions. They also often have added sugar, starch, or salt to make up for flavor that was removed with the fat.  Make a grocery list of lower-calorie foods and stick to it. Cooking  Try to cook your favorite foods in a healthier way. For example, try baking instead of frying.  Use low-fat dairy products. Meal planning  Use more fruits and vegetables. One-half of your plate should be fruits and vegetables.  Include lean proteins, such as chicken, turkey, and fish. Lifestyle Each week, aim to do one of the following:  150 minutes of moderate exercise, such as walking.  75 minutes of vigorous exercise, such as running. General information  Know how many calories are in the foods you eat most often. This will help you calculate calorie counts faster.  Find a way of tracking calories that works for you. Get creative. Try different apps or programs if writing down calories does not work for you. What foods should I eat?  Eat nutritious foods. It is better to have a nutritious,  high-calorie food, such as an avocado, than a food with few nutrients, such as a bag of potato chips.  Use your calories on foods and drinks that will fill you up and will not leave you hungry soon after eating. ? Examples of foods that fill you up are nuts and nut butters, vegetables, lean proteins, and high-fiber foods such as whole grains. High-fiber foods are foods with more than 5 g of fiber per serving.  Pay attention to calories in drinks. Low-calorie drinks include water and unsweetened drinks. The items listed above may not be a complete list of foods and beverages you can eat.   Contact a dietitian for more information.   What foods should I limit? Limit foods or drinks that are not good sources of vitamins, minerals, or protein or that are high in unhealthy fats. These include:  Candy.  Other sweets.  Sodas, specialty coffee drinks, alcohol, and juice. The items listed above may not be a complete list of foods and beverages you should avoid. Contact a dietitian for more information. How do I count calories when eating out?  Pay attention to portions. Often, portions are much larger when eating out. Try these tips to keep portions smaller: ? Consider sharing a meal instead of getting your own. ? If you get your own meal, eat only half of it. Before you start eating, ask for a container and put half of your meal into it. ? When available, consider ordering smaller portions from the menu instead of full portions.  Pay attention to your food and drink choices. Knowing the way food is cooked and what is included with the meal can help you eat fewer calories. ? If calories are listed on the menu, choose the lower-calorie options. ? Choose dishes that include vegetables, fruits, whole grains, low-fat dairy products, and lean proteins. ? Choose items that are boiled, broiled, grilled, or steamed. Avoid items that are buttered, battered, fried, or served with cream sauce. Items labeled as  crispy are usually fried, unless stated otherwise. ? Choose water, low-fat milk, unsweetened iced tea, or other drinks without added sugar. If you want an alcoholic beverage, choose a lower-calorie option, such as a glass of wine or light beer. ? Ask for dressings, sauces, and syrups on the side. These are usually high in calories, so you should limit the amount you eat. ? If you want a salad, choose a garden salad and ask for grilled meats. Avoid extra toppings such as bacon, cheese, or fried items. Ask for the dressing on the side, or ask for olive oil and vinegar or lemon to use as dressing.  Estimate how many servings of a food you are given. Knowing serving sizes will help you be aware of how much food you are eating at restaurants. Where to find more information  Centers for Disease Control and Prevention: www.cdc.gov  [place] Department of Agriculture: myplate.gov Summary  Calorie counting means keeping track of how many calories you eat and drink each day. If you eat fewer calories than your body needs, you should lose weight.  A healthy amount of weight to lose per week is usually 1-2 lb (0.5-0.9 kg). This usually means reducing your daily calorie intake by 500-750 calories.  The number of calories in a food can be found on a Nutrition Facts label. If a food does not have a Nutrition Facts label, try to look up the calories online or ask your dietitian for help.  Use smaller plates, glasses, and bowls for smaller portions and to prevent overeating.  Use your calories on foods and drinks that will fill you up and not leave you hungry shortly after a meal. This information is not intended to replace advice given to you by your health care provider. Make sure you discuss any questions you have with your health care provider. Document Revised: 09/01/2019 Document Reviewed: 09/01/2019 Elsevier Patient Education  2021 [employer]  

## 2021-01-31 ENCOUNTER — Encounter: Payer: Self-pay | Admitting: Obstetrics and Gynecology

## 2021-01-31 ENCOUNTER — Other Ambulatory Visit: Payer: Self-pay

## 2021-01-31 ENCOUNTER — Ambulatory Visit (INDEPENDENT_AMBULATORY_CARE_PROVIDER_SITE_OTHER): Payer: Managed Care, Other (non HMO) | Admitting: Obstetrics and Gynecology

## 2021-01-31 VITALS — BP 130/68 | HR 82 | Ht 67.0 in | Wt 288.0 lb

## 2021-01-31 DIAGNOSIS — R5383 Other fatigue: Secondary | ICD-10-CM

## 2021-01-31 DIAGNOSIS — N914 Secondary oligomenorrhea: Secondary | ICD-10-CM

## 2021-01-31 DIAGNOSIS — L659 Nonscarring hair loss, unspecified: Secondary | ICD-10-CM

## 2021-01-31 DIAGNOSIS — L680 Hirsutism: Secondary | ICD-10-CM | POA: Diagnosis not present

## 2021-01-31 DIAGNOSIS — E282 Polycystic ovarian syndrome: Secondary | ICD-10-CM

## 2021-01-31 DIAGNOSIS — Z6841 Body Mass Index (BMI) 40.0 and over, adult: Secondary | ICD-10-CM

## 2021-01-31 DIAGNOSIS — Z3009 Encounter for other general counseling and advice on contraception: Secondary | ICD-10-CM

## 2021-01-31 LAB — PREGNANCY, URINE: Preg Test, Ur: NEGATIVE

## 2021-01-31 MED ORDER — DROSPIRENONE-ETHINYL ESTRADIOL 3-0.02 MG PO TABS: 1.0000 | ORAL_TABLET | Freq: Every day | ORAL | 0 refills | Status: DC

## 2021-01-31 NOTE — Patient Instructions (Addendum)
Oral Contraception Information Oral contraceptive pills (OCPs) are medicines taken by mouth to prevent pregnancy. They work by: Preventing the ovaries from releasing eggs. Thickening mucus in the lower part of the uterus (cervix). This prevents sperm from entering the uterus. Thinning the lining of the uterus (endometrium). This prevents a fertilized egg from attaching to the endometrium. OCPs are highly effective when taken exactly as prescribed. However, OCPs do not prevent STIs (sexually transmitted infections). Using condoms while on an OCP can help prevent STIs. What happens before starting OCPs? Before you start taking OCPs: You may have a physical exam, blood test, and Pap test. Your health care provider will make sure you are a good candidate for oral contraception. OCPs are not a good option for certain women, such as: Women who smoke and are older than age 35. Women who have or have had certain conditions, such as: A history of high blood pressure. Deep vein thrombosis. Pulmonary embolism. Stroke. Cardiovascular disease. Peripheral vascular disease. Ask your health care provider about the possible side effects of the OCP you may be prescribed. Be aware that it can take 2-3 months for your body to adjustto changes in hormone levels. Types of oral contraception  Birth control pills contain the hormones estrogen and progestin (synthetic progesterone) or progestin only. The combination pill This type of pill contains estrogen and progestin hormones. Conventional contraception pills come in packs of 21 or 28 pills. Some packs with 28-day pills contain estrogen and progestin for the first 21-24 days. Hormone-free tablets, called placebos, are taken for the final 4-7 days. You should have menstrual bleeding during the time you take the placebos. In packs with 21 tablets, you take no pills for 7 days. Menstrual bleeding occurs during these days. (Some people prefer taking a pill for 28  days to help establish a routine). Extended-interval contraception pills come in packs of 91 pills. The first 84 tablets have both estrogen and progestin. The last 7 pills are placebos. Menstrual bleeding occurs during the placebo days. With this schedule, menstrual bleeding happens once every 3 months. Continuous contraception pills come in packs of 28 pills. All pills in the pack contain estrogen and progestin. With this schedule, regular menstrual bleeding does not happen, but there may be spotting or irregular bleeding. Progestin-only pills This type of pill is often called the mini-pill and contains the progestin hormone only. It comes in packs of 28 pills. In some packs, the last 4 pills are placebos. The pill must be taken at the same time every day. This is very important to prevent pregnancy. Menstrual bleeding may not be regular orpredictable. What are the advantages? Oral contraception provides reliable and continuous contraception if taken as directed. It may treat or decrease symptoms of: Menstrual period cramps. Irregular menstrual cycle or bleeding. Heavy menstrual flow. Abnormal uterine bleeding. Acne, depending on the type of pill. Polycystic ovarian syndrome (POS). Endometriosis. Iron deficiency anemia. Premenstrual symptoms, including severe irritability, depression, or anxiety. It also may: Reduce the risk of endometrial and ovarian cancer. Be used as emergency contraception. Prevent ectopic pregnancies and infections of the fallopian tubes. What can make OCPs less effective? OCPs may be less effective if: You forget to take the pill every day. For progestin-only pills, it is especially important to take the pill at the same time each day. Even taking it 3 hours late can increase the risk of pregnancy. You have a stomach or intestinal disease that reduces your body's ability to absorb the pill. You take   OCPs with other medicines that make OCPs less effective, such as  antibiotics, certain HIV medicines, and some seizure medicines. You take expired OCPs. You forget to restart the pill after 7 days of not taking it. This refers to the packs of 21 pills. What are the side effects and risks? OCPs can sometimes cause side effects, such as: Headache. Depression. Trouble sleeping. Nausea and vomiting. Breast tenderness. Irregular bleeding or spotting during the first several months. Bloating or fluid retention. Increase in blood pressure. Combination pills may slightly increase the risk of: Blood clots. Heart attack. Stroke. Follow these instructions at home: Follow instructions from your health care provider about how to start taking your first cycle of OCPs. Depending on when you start the pill, you may need to use a backup form of birth control, such as condoms, during the first week.Make sure you know what steps to take if you forget to take the pill. Summary Oral contraceptive pills (OCPs) are medicines taken by mouth to prevent pregnancy. They are highly effective when taken exactly as prescribed. OCPs contain a combination of the hormones estrogen and progestin (synthetic progesterone) or progestin only. Before you start taking the pill, you may have a physical exam, blood test, and Pap test. Your health care provider will make sure you are a good candidate for oral contraception. The combination pill may come in a 21-day pack, a 28-day pack, or a 91-day pack. Progestin-only pills come in packs of 28 pills. OCPs can sometimes cause side effects, such as headache, nausea, breast tenderness, or irregular bleeding. This information is not intended to replace advice given to you by your health care provider. Make sure you discuss any questions you have with your healthcare provider. Document Revised: 04/20/2020 Document Reviewed: 03/29/2020 Elsevier Patient Education  2022 [employer] Polycystic Ovary Syndrome  Polycystic ovarian syndrome (PCOS) is a  common hormonal disorder among women of reproductive age. In most women with PCOS, small fluid-filled sacs (cysts) grow on the ovaries. PCOS can cause problems with menstrual periods and make it hard to get and stay pregnant. If this condition is not treated, it can leadto serious health problems, such as diabetes and heart disease. What are the causes? The cause of this condition is not known. It may be due to certain factors, such as: Irregular menstrual cycle. High levels of certain hormones. Problems with the hormone that helps to control blood sugar (insulin). Certain genes. What increases the risk? You are more likely to develop this condition if you: Have a family history of PCOS or type 2 diabetes. Are overweight, eat unhealthy foods, and are not active. These factors may cause problems with blood sugar control, which can contribute to PCOS or PCOS symptoms. What are the signs or symptoms? Symptoms of this condition include: Ovarian cysts and sometimes pelvic pain. Menstrual periods that are not regular or are too heavy. Inability to get or stay pregnant. Increased growth of hair on the face, chest, stomach, back, thumbs, thighs, or toes. Acne or oily skin. Acne may develop during adulthood, and it may not get better with treatment. Weight gain or obesity. Patches of thickened and dark Becvar or black skin on the neck, arms, breasts, or thighs. How is this diagnosed? This condition is diagnosed based on: Your medical history. A physical exam that includes a pelvic exam. Your health care provider may look for areas of increased hair growth on your skin. Tests, such as: An ultrasound to check the ovaries for cysts and to  view the lining of the uterus. Blood tests to check levels of sugar (glucose), female hormone (testosterone), and female hormones (estrogen and progesterone). How is this treated? There is no cure for this condition, but treatment can help to manage symptoms and  prevent more health problems from developing. Treatment varies depending onyour symptoms and if you want to have a baby or if you need birth control. Treatment may include: Making nutrition and lifestyle changes. Taking the progesterone hormone to start a menstrual period. Taking birth control pills to help you have regular menstrual periods. Taking medicines such as: Medicines to make you ovulate, if you want to get pregnant. Medicine to reduce extra hair growth. Having surgery in severe cases. This may involve making small holes in one or both of your ovaries. This decreases the amount of testosterone that your body makes. Follow these instructions at home: Take over-the-counter and prescription medicines only as told by your health care provider. Follow a healthy meal plan that includes lean proteins, complex carbohydrates, fresh fruits and vegetables, low-fat dairy products, healthy fats, and fiber. If you are overweight, lose weight as told by your health care provider. Your health care provider can determine how much weight loss is best for you and can help you lose weight safely. Keep all follow-up visits. This is important. Contact a health care provider if: Your symptoms do not get better with medicine. Your symptoms get worse or you develop new symptoms. Summary Polycystic ovarian syndrome (PCOS) is a common hormonal disorder among women of reproductive age. PCOS can cause problems with menstrual periods and make it hard to get and stay pregnant. If this condition is not treated, it can lead to serious health problems, such as diabetes and heart disease. There is no cure for this condition, but treatment can help to manage symptoms and prevent more health problems from developing. This information is not intended to replace advice given to you by your health care provider. Make sure you discuss any questions you have with your healthcare provider. Document Revised: 12/29/2019  Document Reviewed: 12/29/2019 Elsevier Patient Education  2022 [employer].

## 2021-01-31 NOTE — Progress Notes (Signed)
GYNECOLOGY  VISIT   HPI: 45 y.o.   Married Black or [place] American Not Hispanic or Latino  female   [id number] with Patient's last menstrual period was 01/03/2021.   here for irregular periods. She states that some times she will go a month with out a period.  In the last year her cycle have been irregular. Cycles have been every 1-3 months. Bleeds x 3 days. Flow can be light to heavy. At times she can saturate a pad in 2 hours. Occasionally sexually active, no contraception. She hasn't used contraception since she has been with her husband (6 years). No STD concerns. No dyspareunia.   She does c/o fatigue and hair loss.  She has had significant hair growth on her face, worsened in the last year. No issues with acne.  She has had one episode of feeling severe hot. Occasional night sweats.  No Galactorrhea.  She gets boils on her lower abdomen with her cycle.  Daughter is 104, grandson is almost 1. They live in [facility].  She recently had blood work in preparation of a gastric sleeve.   GYNECOLOGIC HISTORY: Patient's last menstrual period was 01/03/2021. Contraception:none  Menopausal hormone therapy: none         OB History     Gravida  1   Para  1   Term  1   Preterm      AB      Living  1      SAB      IAB      Ectopic      Multiple      Live Births  1              Patient Active Problem List   Diagnosis Date Noted   Vitamin D deficiency 08/23/2019   Non-restorative sleep 06/14/2019   Excessive daytime sleepiness 06/14/2019   Inadequate sleep hygiene 06/14/2019   Sleep deprivation 06/14/2019   Healthcare maintenance 05/23/2019   BMI 40.0-44.9, adult (HCC) 05/23/2019   Snoring 05/23/2019   Family history of diabetes mellitus in first degree relative 10/01/2016   Other fatigue 10/01/2016   Routine physical examination 10/01/2016   Morbid obesity (HCC) 10/01/2016   Right ankle pain 10/01/2016    Past Medical History:  Diagnosis Date   Anemia      Past Surgical History:  Procedure Laterality Date   FOOT SURGERY      Current Outpatient Medications  Medication Sig Dispense Refill   B Complex-C-Folic Acid (B COMPLEX-VITAMIN C-FOLIC ACID) 1 MG tablet Take 1 tablet by mouth daily with breakfast. 90 tablet 1   drospirenone-ethinyl estradiol (YAZ) 3-0.02 MG tablet Take 1 tablet by mouth daily. 84 tablet 0   Vitamin D, Ergocalciferol, (DRISDOL) 1.25 MG (50000 UNIT) CAPS capsule Take 1 capsule (50,000 Units total) by mouth every 7 (seven) days. 4 capsule 0   No current facility-administered medications for this visit.     ALLERGIES: Patient has no known allergies.  Family History  Problem Relation Age of Onset   Diabetes Mother    Hyperlipidemia Mother    Hypertension Mother    Diabetes Father    Stroke Father    Healthy Sister    Diabetes Brother    Stroke Brother    Seizures Brother    Healthy Brother    Diabetes Maternal Grandmother     Social History   Socioeconomic History   Marital status: Married    Spouse name: Not on file   Number  of children: Not on file   Years of education: Not on file   Highest education level: Not on file  Occupational History   Not on file  Tobacco Use   Smoking status: Never   Smokeless tobacco: Never  Vaping Use   Vaping Use: Never used  Substance and Sexual Activity   Alcohol use: Yes    Comment: occassionally   Drug use: Never   Sexual activity: Yes    Birth control/protection: None  Other Topics Concern   Not on file  Social History Narrative   ** Merged History Encounter **       Social Determinants of Health   Financial Resource Strain: Not on file  Food Insecurity: Not on file  Transportation Needs: Not on file  Physical Activity: Not on file  Stress: Not on file  Social Connections: Not on file  Intimate Partner Violence: Not on file    Review of Systems  All other systems reviewed and are negative.  PHYSICAL EXAMINATION:    BP 130/68   Pulse 82    Ht 5\' 7"  (1.702 m)   Wt 288 lb (130.6 kg)   LMP 01/03/2021   SpO2 99%   BMI 45.11 kg/m     General appearance: alert, cooperative and appears stated age Skin: marked hirsutism on her face, checks/chin.  Neck: no adenopathy, supple, symmetrical, trachea midline and thyroid normal to inspection and palpation Abdomen: soft, non-tender; non distended, no masses,  no organomegaly  Patient pulled up her [facility] from 01/15/21: Creat 0.61 GFR 130 ALT 12 AST 15 HgbA1C 6% Hgb 12.3 TSH 0.98 FTR 6.5 Cholesterol 248, HDL 53, trig 87, LDL 135, non hdl 195, ratio 4.7    1. Secondary oligomenorrhea -Discussed importance of regular  Pregnancy, urine: negative TSH results [facility] was 0.98 - Follicle stimulating hormone - Estradiol - Prolactin - drospirenone-ethinyl estradiol (YAZ) 3-0.02 MG tablet; Take 1 tablet by mouth daily.  Dispense: 84 tablet; Refill: 0  2. Hirsutism - 17-Hydroxyprogesterone - Testosterone, Total, LC/MS/MS  3. Hair loss - TSH  4. Other fatigue - TSH  5. BMI 45.0-49.9, adult [facility]) She is planning on getting a gastric sleeve  6. PCOS (polycystic ovarian syndrome) Clinical picture c/w PCOS, reviewed PCOS, information given  7. General counseling and advice on female contraception No contraindications, risks reviewed. Discussed possible increased risk of blood clots on the yaz.  - drospirenone-ethinyl estradiol (YAZ) 3-0.02 MG tablet; Take 1 tablet by mouth daily.  Dispense: 84 tablet; Refill: 0 -F/U in 3 months ago

## 2021-02-04 ENCOUNTER — Encounter: Payer: Self-pay | Admitting: Obstetrics and Gynecology

## 2021-02-05 LAB — ESTRADIOL: Estradiol: 81 pg/mL

## 2021-02-05 LAB — FOLLICLE STIMULATING HORMONE: FSH: 8.4 m[IU]/mL

## 2021-02-05 LAB — TESTOSTERONE, TOTAL, LC/MS/MS: Testosterone, Total, LC-MS-MS: 24 ng/dL (ref 2–45)

## 2021-02-05 LAB — 17-HYDROXYPROGESTERONE: 17-OH-Progesterone, LC/MS/MS: 90 ng/dL

## 2021-02-05 LAB — PROLACTIN: Prolactin: 12.5 ng/mL

## 2021-02-11 ENCOUNTER — Other Ambulatory Visit: Payer: Self-pay

## 2021-02-11 ENCOUNTER — Encounter: Payer: Managed Care, Other (non HMO) | Attending: Surgery | Admitting: Skilled Nursing Facility1

## 2021-02-11 NOTE — Progress Notes (Signed)
Pre-Operative Nutrition Class:    Patient was seen on 02/11/2021 for Pre-Operative Bariatric Surgery Education at the [facility].    Surgery date:  Surgery type: sleeve Start weight at [facility]: 293 Weight today: 283.1  Samples given per MNT protocol. Patient educated on appropriate usage: Protien 20 shake Lot: [id number] Exp: 12/14/21   Bariatric Advantage Calcium Lot # [id number] Exp: 02/23     procare Vitamins Calcium Lot # [id number] Exp: 01/23   Celebrate Protein Powder   Lot # [id number] Exp: 04/23  The following the learning objectives were met by the patient during this course: Identify Pre-Op Dietary Goals and will begin 2 weeks pre-operatively Identify appropriate sources of fluids and proteins  State protein recommendations and appropriate sources pre and post-operatively Identify Post-Operative Dietary Goals and will follow for 2 weeks post-operatively Identify appropriate multivitamin and calcium sources Describe the need for physical activity post-operatively and will follow MD recommendations State when to call healthcare provider regarding medication questions or post-operative complications When having a diagnosis of diabetes understanding hypoglycemia symptoms and the inclusion of 1 complex carbohydrate per meal  Handouts given during class include: Pre-Op Bariatric Surgery Diet Handout Protein Shake Handout Post-Op Bariatric Surgery Nutrition Handout BELT Program Information Flyer Support Group Information Flyer [facility] Bariatric Supplements Price List  Follow-Up Plan: Patient will follow-up at [facility] 2 weeks post operatively for diet advancement per MD.

## 2021-02-13 ENCOUNTER — Ambulatory Visit: Payer: Self-pay | Admitting: Surgery

## 2021-02-13 NOTE — H&P (Signed)
Surgical Evaluation   Chief Complaint: morbid obesity  HPI:  She returns for follow-up regarding surgical management of severe obesity.  She has completed the bariatric pathway with no barriers identified.  Has several insightful questions to discuss today.  Denies any significant changes to her health since her initial visit.  CXR/UGI negative Labs- reviewed, no significant abnormalities. HgA1c 6 Psych Cyndia Skeeters)- approved Dietician Marine scientist)- approved  Initial visit 09/06/20:  45 year old woman presents for consultation regarding surgical management for morbid obesity.  She has been working on this both on her own and with her primary care provider.  She began phentermine last May, which she tolerated without issues, in addition to dietary changes and an exercise program with a personal trainer and daily walks.  She had a fair amount of success with this however subsequently had some stress and does admit to emotional eating.  She has regained the weight.  She has been through this cycle many times; she has previously tried intermittent fasting, low calorie diet, and exercise.  Medical history of anemia, hyperlipidemia, depression.  Sleep study in November 2020 positives for moderate to severe sleep apnea and CPAP was recommended at that time, but she did not think she would tolerate it.  Her primary care provider is Mayer Masker, PA-C  Prior surgery includes foot surgery  Family history notable for diabetes and both parents, 1 brother and maternal grandmother, as well as hypertension, hyperlipidemia, stroke, and seizures  Social history endorses occasional alcohol, no drug use, does not use any tobacco products.  She lives at home with her husband who is supportive of her undergoing bariatric surgery.  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 No Known Allergies  Past Medical History:  Diagnosis Date   Anemia     Past Surgical History:  Procedure Laterality Date   FOOT  SURGERY      Family History  Problem Relation Age of Onset   Diabetes Mother    Hyperlipidemia Mother    Hypertension Mother    Diabetes Father    Stroke Father    Healthy Sister    Diabetes Brother    Stroke Brother    Seizures Brother    Healthy Brother    Diabetes Maternal Grandmother     Social History   Socioeconomic History   Marital status: Married    Spouse name: Not on file   Number of children: Not on file   Years of education: Not on file   Highest education level: Not on file  Occupational History   Not on file  Tobacco Use   Smoking status: Never   Smokeless tobacco: Never  Vaping Use   Vaping Use: Never used  Substance and Sexual Activity   Alcohol use: Yes    Comment: occassionally   Drug use: Never   Sexual activity: Yes    Birth control/protection: None  Other Topics Concern   Not on file  Social History Narrative   ** Merged History Encounter **       Social Determinants of Health   Financial Resource Strain: Not on file  Food Insecurity: Not on file  Transportation Needs: Not on file  Physical Activity: Not on file  Stress: Not on file  Social Connections: Not on file    Current Outpatient Medications on File Prior to Visit  Medication Sig Dispense Refill   B Complex-C-Folic Acid (B COMPLEX-VITAMIN C-FOLIC ACID) 1 MG tablet Take 1 tablet by mouth daily with breakfast. 90 tablet 1  drospirenone-ethinyl estradiol (YAZ) 3-0.02 MG tablet Take 1 tablet by mouth daily. 84 tablet 0   Vitamin D, Ergocalciferol, (DRISDOL) 1.25 MG (50000 UNIT) CAPS capsule Take 1 capsule (50,000 Units total) by mouth every 7 (seven) days. 4 capsule 0   No current facility-administered medications on file prior to visit.    Review of Systems: a complete, 10pt review of systems was completed with pertinent positives and negatives as documented in the HPI  Physical Exam: There were no vitals filed for this visit. Gen: A&Ox3, no distress  Eyes: lids and  conjunctivae normal, no icterus. Pupils equally round and reactive to light.  Neck: supple without mass or thyromegaly Chest: respiratory effort is normal. No crepitus or tenderness on palpation of the chest. Breath sounds equal.  Cardiovascular: RRR with palpable distal pulses, no pedal edema Gastrointestinal: soft, nondistended, nontender. No mass, hepatomegaly or splenomegaly. No hernia. Lymphatic: no lymphadenopathy in the neck or groin Muscoloskeletal: no clubbing or cyanosis of the fingers.  Strength is symmetrical throughout.  Range of motion of bilateral upper and lower extremities normal without pain, crepitation or contracture. Neuro: cranial nerves grossly intact.  Sensation intact to light touch diffusely. Psych: appropriate mood and affect, normal insight/judgment intact  Skin: warm and dry   CBC Latest Ref Rng & Units 05/23/2019 10/21/2016  WBC 3.4 - 10.8 x10E3/uL 6.6 CANCELED  Hemoglobin 11.1 - 15.9 g/dL 86.7 -  Hematocrit 61.9 - 46.6 % 36.7 -  Platelets 150 - 450 x10E3/uL 246 -    CMP Latest Ref Rng & Units 12/05/2019 05/23/2019 10/21/2016  Glucose 65 - 99 mg/dL 509(T) 84 267(T)  BUN 6 - 24 mg/dL 10 9 13   Creatinine 0.57 - 1.00 mg/dL 2.45 8.09)  Sodium 134 - 144 mmol/L 139 140 145(H)  Potassium 3.5 - 5.2 mmol/L 4.0 5.0 5.4(H)  Chloride 96 - 106 mmol/L 105 105 107(H)  CO2 20 - 29 mmol/L 22 24 19   Calcium 8.7 - 10.2 mg/dL 9.1 9.1 9.3  Total Protein 6.0 - 8.5 g/dL 7.0 7.1 7.1  Total Bilirubin 0.0 - 1.2 mg/dL 9.83(J 0.3 0.3  Alkaline Phos 39 - 117 IU/L 67 67 59  AST 0 - 40 IU/L 15 16 26   ALT 0 - 32 IU/L 13 14 8     No results found for: INR, PROTIME  Imaging: No results found.   A/P:   MORBID OBESITY (E66.01) Story: She remains a good candidate for sleeve gastrectomy. We have previously discussed the surgery including technical aspects, the risks of bleeding, infection, pain, scarring, injury to intra-abdominal structures, staple line leak or abscess, chronic  abdominal pain or nausea, new onset or worsened GERD, DVT/PE, pneumonia, heart attack, stroke, death, failure to reach weight loss goals and weight regain, hernia. Discussed the typical pre-, peri-, and postoperative course. Discussed the importance of lifelong behavioral changes to combat the chronic and relapsing disease which is obesity. Questions were welcomed and answered. Plan to proceed as scheduled for surgery 02/26/21.  Long-term weight loss goal: For her height, BMI less than 30 approximate the weight less than 190lb  OBSTRUCTIVE SLEEP APNEA (G47.33) Story: Noted on sleep study in November 2020. Not currently on CPAP. ANEMIA (D64.9) Story: Last labs available October 2020, hemoglobin was 11.9 HYPERLIPIDEMIA (E78.5) Story: Last labs were October 2020, total cholesterol 206, LDL 134, HDL 58. Not currently on medication.  Patient Active Problem List   Diagnosis Date Noted   Vitamin D deficiency 08/23/2019   Non-restorative sleep 06/14/2019   Excessive daytime  sleepiness 06/14/2019   Inadequate sleep hygiene 06/14/2019   Sleep deprivation 06/14/2019   Healthcare maintenance 05/23/2019   BMI 40.0-44.9, adult (HCC) 05/23/2019   Snoring 05/23/2019   Family history of diabetes mellitus in first degree relative 10/01/2016   Other fatigue 10/01/2016   Routine physical examination 10/01/2016   Morbid obesity (HCC) 10/01/2016   Right ankle pain 10/01/2016       Phylliss Blakes, MD [facility]  See AMION to contact appropriate on-call provider

## 2021-02-13 NOTE — H&P (View-Only) (Signed)
Surgical Evaluation   Chief Complaint: morbid obesity  HPI:  She returns for follow-up regarding surgical management of severe obesity.  She has completed the bariatric pathway with no barriers identified.  Has several insightful questions to discuss today.  Denies any significant changes to her health since her initial visit.  CXR/UGI negative Labs- reviewed, no significant abnormalities. HgA1c 6 Psych Cyndia Skeeters)- approved Dietician Marine scientist)- approved  Initial visit 09/06/20:  45 year old woman presents for consultation regarding surgical management for morbid obesity.  She has been working on this both on her own and with her primary care provider.  She began phentermine last May, which she tolerated without issues, in addition to dietary changes and an exercise program with a personal trainer and daily walks.  She had a fair amount of success with this however subsequently had some stress and does admit to emotional eating.  She has regained the weight.  She has been through this cycle many times; she has previously tried intermittent fasting, low calorie diet, and exercise.  Medical history of anemia, hyperlipidemia, depression.  Sleep study in November 2020 positives for moderate to severe sleep apnea and CPAP was recommended at that time, but she did not think she would tolerate it.  Her primary care provider is Mayer Masker, PA-C  Prior surgery includes foot surgery  Family history notable for diabetes and both parents, 1 brother and maternal grandmother, as well as hypertension, hyperlipidemia, stroke, and seizures  Social history endorses occasional alcohol, no drug use, does not use any tobacco products.  She lives at home with her husband who is supportive of her undergoing bariatric surgery.  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 No Known Allergies  Past Medical History:  Diagnosis Date   Anemia     Past Surgical History:  Procedure Laterality Date   FOOT  SURGERY      Family History  Problem Relation Age of Onset   Diabetes Mother    Hyperlipidemia Mother    Hypertension Mother    Diabetes Father    Stroke Father    Healthy Sister    Diabetes Brother    Stroke Brother    Seizures Brother    Healthy Brother    Diabetes Maternal Grandmother     Social History   Socioeconomic History   Marital status: Married    Spouse name: Not on file   Number of children: Not on file   Years of education: Not on file   Highest education level: Not on file  Occupational History   Not on file  Tobacco Use   Smoking status: Never   Smokeless tobacco: Never  Vaping Use   Vaping Use: Never used  Substance and Sexual Activity   Alcohol use: Yes    Comment: occassionally   Drug use: Never   Sexual activity: Yes    Birth control/protection: None  Other Topics Concern   Not on file  Social History Narrative   ** Merged History Encounter **       Social Determinants of Health   Financial Resource Strain: Not on file  Food Insecurity: Not on file  Transportation Needs: Not on file  Physical Activity: Not on file  Stress: Not on file  Social Connections: Not on file    Current Outpatient Medications on File Prior to Visit  Medication Sig Dispense Refill   B Complex-C-Folic Acid (B COMPLEX-VITAMIN C-FOLIC ACID) 1 MG tablet Take 1 tablet by mouth daily with breakfast. 90 tablet 1  drospirenone-ethinyl estradiol (YAZ) 3-0.02 MG tablet Take 1 tablet by mouth daily. 84 tablet 0   Vitamin D, Ergocalciferol, (DRISDOL) 1.25 MG (50000 UNIT) CAPS capsule Take 1 capsule (50,000 Units total) by mouth every 7 (seven) days. 4 capsule 0   No current facility-administered medications on file prior to visit.    Review of Systems: a complete, 10pt review of systems was completed with pertinent positives and negatives as documented in the HPI  Physical Exam: There were no vitals filed for this visit. Gen: A&Ox3, no distress  Eyes: lids and  conjunctivae normal, no icterus. Pupils equally round and reactive to light.  Neck: supple without mass or thyromegaly Chest: respiratory effort is normal. No crepitus or tenderness on palpation of the chest. Breath sounds equal.  Cardiovascular: RRR with palpable distal pulses, no pedal edema Gastrointestinal: soft, nondistended, nontender. No mass, hepatomegaly or splenomegaly. No hernia. Lymphatic: no lymphadenopathy in the neck or groin Muscoloskeletal: no clubbing or cyanosis of the fingers.  Strength is symmetrical throughout.  Range of motion of bilateral upper and lower extremities normal without pain, crepitation or contracture. Neuro: cranial nerves grossly intact.  Sensation intact to light touch diffusely. Psych: appropriate mood and affect, normal insight/judgment intact  Skin: warm and dry   CBC Latest Ref Rng & Units 05/23/2019 10/21/2016  WBC 3.4 - 10.8 x10E3/uL 6.6 CANCELED  Hemoglobin 11.1 - 15.9 g/dL 86.7 -  Hematocrit 61.9 - 46.6 % 36.7 -  Platelets 150 - 450 x10E3/uL 246 -    CMP Latest Ref Rng & Units 12/05/2019 05/23/2019 10/21/2016  Glucose 65 - 99 mg/dL 509(T) 84 267(T)  BUN 6 - 24 mg/dL 10 9 13   Creatinine 0.57 - 1.00 mg/dL 2.45 8.09)  Sodium 134 - 144 mmol/L 139 140 145(H)  Potassium 3.5 - 5.2 mmol/L 4.0 5.0 5.4(H)  Chloride 96 - 106 mmol/L 105 105 107(H)  CO2 20 - 29 mmol/L 22 24 19   Calcium 8.7 - 10.2 mg/dL 9.1 9.1 9.3  Total Protein 6.0 - 8.5 g/dL 7.0 7.1 7.1  Total Bilirubin 0.0 - 1.2 mg/dL 9.83(J 0.3 0.3  Alkaline Phos 39 - 117 IU/L 67 67 59  AST 0 - 40 IU/L 15 16 26   ALT 0 - 32 IU/L 13 14 8     No results found for: INR, PROTIME  Imaging: No results found.   A/P:   MORBID OBESITY (E66.01) Story: She remains a good candidate for sleeve gastrectomy. We have previously discussed the surgery including technical aspects, the risks of bleeding, infection, pain, scarring, injury to intra-abdominal structures, staple line leak or abscess, chronic  abdominal pain or nausea, new onset or worsened GERD, DVT/PE, pneumonia, heart attack, stroke, death, failure to reach weight loss goals and weight regain, hernia. Discussed the typical pre-, peri-, and postoperative course. Discussed the importance of lifelong behavioral changes to combat the chronic and relapsing disease which is obesity. Questions were welcomed and answered. Plan to proceed as scheduled for surgery 02/26/21.  Long-term weight loss goal: For her height, BMI less than 30 approximate the weight less than 190lb  OBSTRUCTIVE SLEEP APNEA (G47.33) Story: Noted on sleep study in November 2020. Not currently on CPAP. ANEMIA (D64.9) Story: Last labs available October 2020, hemoglobin was 11.9 HYPERLIPIDEMIA (E78.5) Story: Last labs were October 2020, total cholesterol 206, LDL 134, HDL 58. Not currently on medication.  Patient Active Problem List   Diagnosis Date Noted   Vitamin D deficiency 08/23/2019   Non-restorative sleep 06/14/2019   Excessive daytime  sleepiness 06/14/2019   Inadequate sleep hygiene 06/14/2019   Sleep deprivation 06/14/2019   Healthcare maintenance 05/23/2019   BMI 40.0-44.9, adult (HCC) 05/23/2019   Snoring 05/23/2019   Family history of diabetes mellitus in first degree relative 10/01/2016   Other fatigue 10/01/2016   Routine physical examination 10/01/2016   Morbid obesity (HCC) 10/01/2016   Right ankle pain 10/01/2016       Phylliss Blakes, MD [facility]  See AMION to contact appropriate on-call provider

## 2021-02-21 NOTE — Patient Instructions (Addendum)
DUE TO COVID-19 ONLY ONE VISITOR IS ALLOWED TO COME WITH YOU AND STAY IN THE WAITING ROOM ONLY DURING PRE OP AND PROCEDURE.   **NO VISITORS ARE ALLOWED IN THE SHORT STAY AREA OR RECOVERY ROOM!!**  IF YOU WILL BE ADMITTED INTO THE HOSPITAL YOU ARE ALLOWED ONLY TWO SUPPORT PEOPLE DURING VISITATION HOURS ONLY (10AM -8PM)   The support person(s) may change daily. The support person(s) must pass our screening, gel in and out, and wear a mask at all times, including in the patient's room. Patients must also wear a mask when staff or their support person are in the room.  No visitors under the age of 43. Any visitor under the age of 43 must be accompanied by an adult.    COVID SWAB TESTING MUST BE COMPLETED ON:  02/22/21 @11 :55 AM   [address] [place], [place] [id number]   [address] [place] (backside of the building) You are not required to quarantine, however you are required to wear a well-fitted mask when you are out and around people not in your household.  Hand Hygiene often Do NOT share personal items Notify your provider if you are in close contact with someone who has COVID or you develop fever 100.4 or greater, new onset of sneezing, cough, sore throat, shortness of breath or body aches.   Your procedure is scheduled on: 02/26/21   [facility]    Report to admitting at 11:45 AM   Call this number if you have problems the morning of surgery [phone]   Do not eat food :After Midnight   May have clear liquids until 10:45 AM morning of surgery  MORNING OF SURGERY DRINK:   DRINK 1 G2 drink BEFORE YOU LEAVE HOME, DRINK ALL OF THE  G2 DRINK AT ONE TIME.   NO SOLID FOOD AFTER 600 PM THE NIGHT BEFORE YOUR SURGERY. YOU MAY DRINK CLEAR FLUIDS. THE G2 DRINK YOU DRINK BEFORE YOU LEAVE HOME WILL BE THE LAST FLUIDS YOU DRINK BEFORE SURGERY.  PAIN IS EXPECTED AFTER SURGERY AND WILL NOT BE COMPLETELY ELIMINATED. AMBULATION AND TYLENOL WILL  HELP REDUCE INCISIONAL AND GAS PAIN. MOVEMENT IS KEY!  YOU ARE EXPECTED TO BE OUT OF BED WITHIN 4 HOURS OF ADMISSION TO YOUR PATIENT ROOM.  SITTING IN THE RECLINER THROUGHOUT THE DAY IS IMPORTANT FOR DRINKING FLUIDS AND MOVING GAS THROUGHOUT THE GI TRACT.  COMPRESSION STOCKINGS SHOULD BE WORN [facility] YOUR [facility] STAY UNLESS YOU ARE WALKING.   INCENTIVE SPIROMETER SHOULD BE USED EVERY HOUR WHILE AWAKE TO DECREASE POST-OPERATIVE COMPLICATIONS SUCH AS PNEUMONIA.  WHEN DISCHARGED HOME, IT IS IMPORTANT TO CONTINUE TO WALK EVERY HOUR AND USE THE INCENTIVE SPIROMETER EVERY HOUR.    CLEAR LIQUID DIET  Foods Allowed                                                                     Foods Excluded  Water, Black Coffee and tea, regular and decaf                             liquids that you cannot  Plain Jell-O in any flavor  (No red)  see through such as: Fruit ices (not with fruit pulp)                                     milk, soups, orange juice              Iced Popsicles (No red)                                    All solid food                                   Apple juices Sports drinks like Gatorade (No red) Lightly seasoned clear broth or consume(fat free) Sugar, honey syrup      The day of surgery:  Drink ONE (1) Pre-Surgery G2 by 10:45 am the morning of surgery. Drink in one sitting. Do not sip.  This drink was given to you during your hospital  pre-op appointment visit. Nothing else to drink after completing the  Pre-Surgery G2.          If you have questions, please contact your surgeon's office.     Oral Hygiene is also important to reduce your risk of infection.                                    Remember - BRUSH YOUR TEETH THE MORNING OF SURGERY WITH YOUR REGULAR TOOTHPASTE   Take these medicines the morning of surgery with A SIP OF WATER: None                              You may not have any metal on your body including  hair pins, jewelry, and body piercing             Do not wear make-up, lotions, powders, perfumes, or deodorant  Do not wear nail polish including gel and S&S, artificial/acrylic nails, or any other type of covering on natural nails including finger and toenails. If you have artificial nails, gel coating, etc. that needs to be removed by a nail salon please have this removed prior to surgery or surgery may need to be canceled/ delayed if the surgeon/ anesthesia feels like they are unable to be safely monitored.   Do not shave  48 hours prior to surgery.    Do not bring valuables to the hospital. [facility] IS NOT             RESPONSIBLE   FOR VALUABLES.   Contacts, dentures or bridgework may not be worn into surgery.   Bring small overnight bag day of surgery.   Special Instructions: Bring a copy of your healthcare power of attorney and living will documents         the day of surgery if you haven't scanned them in before.  Please read over the following fact sheets you were given: IF YOU HAVE QUESTIONS ABOUT YOUR PRE OP INSTRUCTIONS PLEASE CALL [phone]- [facility] - Preparing for Surgery Before surgery, you can play an important role.  Because skin is not sterile, your skin needs to be as free of germs as possible.  You can reduce the number of germs on your skin by washing with CHG (chlorahexidine gluconate) soap before surgery.  CHG is an antiseptic cleaner which kills germs and bonds with the skin to continue killing germs even after washing. Please DO NOT use if you have an allergy to CHG or antibacterial soaps.  If your skin becomes reddened/irritated stop using the CHG and inform your nurse when you arrive at Short Stay. Do not shave (including legs and underarms) for at least 48 hours prior to the first CHG shower.  You may shave your face/neck.  Please follow these instructions carefully:  1.  Shower with CHG Soap the night before surgery and the  morning of  surgery.  2.  If you choose to wash your hair, wash your hair first as usual with your normal  shampoo.  3.  After you shampoo, rinse your hair and body thoroughly to remove the shampoo.                             4.  Use CHG as you would any other liquid soap.  You can apply chg directly to the skin and wash.  Gently with a scrungie or clean washcloth.  5.  Apply the CHG Soap to your body ONLY FROM THE NECK DOWN.   Do   not use on face/ open                           Wound or open sores. Avoid contact with eyes, ears mouth and   genitals (private parts).                       Wash face,  Genitals (private parts) with your normal soap.             6.  Wash thoroughly, paying special attention to the area where your    surgery  will be performed.  7.  Thoroughly rinse your body with warm water from the neck down.  8.  DO NOT shower/wash with your normal soap after using and rinsing off the CHG Soap.                9.  Pat yourself dry with a clean towel.            10.  Wear clean pajamas.            11.  Place clean sheets on your bed the night of your first shower and do not  sleep with pets. Day of Surgery : Do not apply any lotions/deodorants the morning of surgery.  Please wear clean clothes to the hospital/surgery center.  FAILURE TO FOLLOW THESE INSTRUCTIONS MAY RESULT IN THE CANCELLATION OF YOUR SURGERY  PATIENT SIGNATURE_________________________________  NURSE SIGNATURE__________________________________  ________________________________________________________________________  WHAT IS A BLOOD TRANSFUSION? Blood Transfusion Information  A transfusion is the replacement of blood or some of its parts. Blood is made up of multiple cells which provide different functions. Red blood cells carry oxygen and are used for blood loss replacement. White blood cells fight against infection. Platelets control bleeding. Plasma helps clot blood. Other blood products are available for  specialized needs, such as hemophilia or other clotting disorders. BEFORE THE TRANSFUSION  Who gives blood for transfusions?  Healthy volunteers who are fully evaluated to make sure their blood is safe. This is blood bank blood. Transfusion therapy  is the safest it has ever been in the practice of medicine. Before blood is taken from a donor, a complete history is taken to make sure that person has no history of diseases nor engages in risky social behavior (examples are intravenous drug use or sexual activity with multiple partners). The donor's travel history is screened to minimize risk of transmitting infections, such as malaria. The donated blood is tested for signs of infectious diseases, such as HIV and hepatitis. The blood is then tested to be sure it is compatible with you in order to minimize the chance of a transfusion reaction. If you or a relative donates blood, this is often done in anticipation of surgery and is not appropriate for emergency situations. It takes many days to process the donated blood. RISKS AND COMPLICATIONS Although transfusion therapy is very safe and saves many lives, the main dangers of transfusion include:  Getting an infectious disease. Developing a transfusion reaction. This is an allergic reaction to something in the blood you were given. Every precaution is taken to prevent this. The decision to have a blood transfusion has been considered carefully by your caregiver before blood is given. Blood is not given unless the benefits outweigh the risks. AFTER THE TRANSFUSION Right after receiving a blood transfusion, you will usually feel much better and more energetic. This is especially true if your red blood cells have gotten low (anemic). The transfusion raises the level of the red blood cells which carry oxygen, and this usually causes an energy increase. The nurse administering the transfusion will monitor you carefully for complications. HOME CARE INSTRUCTIONS   No special instructions are needed after a transfusion. You may find your energy is better. Speak with your caregiver about any limitations on activity for underlying diseases you may have. SEEK MEDICAL CARE IF:  Your condition is not improving after your transfusion. You develop redness or irritation at the intravenous (IV) site. SEEK IMMEDIATE MEDICAL CARE IF:  Any of the following symptoms occur over the next 12 hours: Shaking chills. You have a temperature by mouth above 102 F (38.9 C), not controlled by medicine. Chest, back, or muscle pain. People around you feel you are not acting correctly or are confused. Shortness of breath or difficulty breathing. Dizziness and fainting. You get a rash or develop hives. You have a decrease in urine output. Your urine turns a dark color or changes to pink, red, or Sabol. Any of the following symptoms occur over the next 10 days: You have a temperature by mouth above 102 F (38.9 C), not controlled by medicine. Shortness of breath. Weakness after normal activity. The white part of the eye turns yellow (jaundice). You have a decrease in the amount of urine or are urinating less often. Your urine turns a dark color or changes to pink, red, or Lamb. Document Released: 07/18/2000 Document Revised: 10/13/2011 Document Reviewed: 03/06/2008 [facility] Patient Information 2014 [place], [place].  _______________________________________________________________________

## 2021-02-21 NOTE — Progress Notes (Addendum)
COVID Vaccine Completed: yes x2 Date COVID Vaccine completed: 10/13/19, 11/03/19 Has received booster: COVID vaccine manufacturer: Pfizer       Date of COVID positive in last 90 days: N/A  PCP - Mayer Masker, PA Cardiologist - N/A  Chest x-ray - 09/17/20 Epic EKG - 09/17/20 Epic Stress Test - N/A ECHO - N/A Cardiac Cath - N/a Pacemaker/ICD device last checked: N/A Spinal Cord Stimulator: N/A  Sleep Study - yes positive CPAP - no machine  Fasting Blood Sugar - N/A Checks Blood Sugar _____ times a day  Blood Thinner Instructions:N/a Aspirin Instructions: Last Dose:  Activity level: Can go up a flight of stairs and perform activities of daily living without stopping and without symptoms of chest pain or shortness of breath.       Anesthesia review:   Patient denies shortness of breath, fever, cough and chest pain at PAT appointment   Patient verbalized understanding of instructions that were given to them at the PAT appointment. Patient was also instructed that they will need to review over the PAT instructions again at home before surgery.

## 2021-02-22 ENCOUNTER — Other Ambulatory Visit: Payer: Self-pay

## 2021-02-22 ENCOUNTER — Encounter (HOSPITAL_COMMUNITY): Payer: Self-pay

## 2021-02-22 ENCOUNTER — Other Ambulatory Visit: Payer: Self-pay | Admitting: Surgery

## 2021-02-22 ENCOUNTER — Encounter (HOSPITAL_COMMUNITY)
Admission: RE | Admit: 2021-02-22 | Discharge: 2021-02-22 | Disposition: A | Discharge status: Home / Self Care | Payer: Managed Care, Other (non HMO) | Source: Ambulatory Visit | Attending: Surgery | Admitting: Surgery

## 2021-02-22 ENCOUNTER — Other Ambulatory Visit (HOSPITAL_COMMUNITY): Payer: Managed Care, Other (non HMO)

## 2021-02-22 DIAGNOSIS — Z01812 Encounter for preprocedural laboratory examination: Secondary | ICD-10-CM | POA: Diagnosis not present

## 2021-02-22 HISTORY — DX: Other complications of anesthesia, initial encounter: T88.59XA

## 2021-02-22 HISTORY — DX: Unspecified osteoarthritis, unspecified site: M19.90

## 2021-02-22 HISTORY — DX: Sleep apnea, unspecified: G47.30

## 2021-02-22 LAB — CBC WITH DIFFERENTIAL/PLATELET
Abs Immature Granulocytes: 0.03 10*3/uL (ref 0.00–0.07)
Basophils Absolute: 0 10*3/uL (ref 0.0–0.1)
Basophils Relative: 0 %
Eosinophils Absolute: 0.1 10*3/uL (ref 0.0–0.5)
Eosinophils Relative: 1 %
HCT: 39.5 % (ref 36.0–46.0)
Hemoglobin: 12.3 g/dL (ref 12.0–15.0)
Immature Granulocytes: 1 %
Lymphocytes Relative: 40 %
Lymphs Abs: 2.4 10*3/uL (ref 0.7–4.0)
MCH: 26.6 pg (ref 26.0–34.0)
MCHC: 31.1 g/dL (ref 30.0–36.0)
MCV: 85.5 fL (ref 80.0–100.0)
Monocytes Absolute: 0.4 10*3/uL (ref 0.1–1.0)
Monocytes Relative: 7 %
Neutro Abs: 3.1 10*3/uL (ref 1.7–7.7)
Neutrophils Relative %: 51 %
Platelets: 245 10*3/uL (ref 150–400)
RBC: 4.62 MIL/uL (ref 3.87–5.11)
RDW: 16 % — ABNORMAL HIGH (ref 11.5–15.5)
WBC: 6.1 10*3/uL (ref 4.0–10.5)
nRBC: 0 % (ref 0.0–0.2)

## 2021-02-22 LAB — COMPREHENSIVE METABOLIC PANEL
ALT: 12 U/L (ref 0–44)
AST: 18 U/L (ref 15–41)
Albumin: 3.8 g/dL (ref 3.5–5.0)
Alkaline Phosphatase: 58 U/L (ref 38–126)
Anion gap: 10 (ref 5–15)
BUN: 12 mg/dL (ref 6–20)
CO2: 23 mmol/L (ref 22–32)
Calcium: 9.1 mg/dL (ref 8.9–10.3)
Chloride: 105 mmol/L (ref 98–111)
Creatinine, Ser: 0.54 mg/dL (ref 0.44–1.00)
GFR, Estimated: >60 mL/min (ref >60)
Glucose, Bld: 114 mg/dL — ABNORMAL HIGH (ref 70–99)
Potassium: 4 mmol/L (ref 3.5–5.1)
Sodium: 138 mmol/L (ref 135–145)
Total Bilirubin: 0.3 mg/dL (ref 0.3–1.2)
Total Protein: 7.3 g/dL (ref 6.5–8.1)

## 2021-02-24 LAB — SARS CORONAVIRUS 2 (TAT 6-24 HRS): SARS Coronavirus 2: NEGATIVE

## 2021-02-25 MED ORDER — BUPIVACAINE LIPOSOME 1.3 % IJ SUSP
20.0000 mL | INTRAMUSCULAR | Status: DC
  Filled 2021-02-25: qty 20

## 2021-02-25 MED ORDER — SODIUM CHLORIDE 0.9 % IV SOLN
2.0000 g | INTRAVENOUS | Status: AC
  Administered 2021-02-26: 2 g via INTRAVENOUS
  Filled 2021-02-25: qty 2

## 2021-02-26 ENCOUNTER — Encounter (HOSPITAL_COMMUNITY)
Admission: RE | Disposition: A | Discharge status: Home / Self Care | Payer: Self-pay | Source: Home / Self Care | Attending: Surgery

## 2021-02-26 ENCOUNTER — Other Ambulatory Visit (HOSPITAL_COMMUNITY): Payer: Self-pay

## 2021-02-26 ENCOUNTER — Inpatient Hospital Stay (HOSPITAL_COMMUNITY)
Admission: RE | Admit: 2021-02-26 | Discharge: 2021-02-28 | DRG: 621 | Disposition: A | Discharge status: Home / Self Care | Payer: Managed Care, Other (non HMO) | Attending: Surgery | Admitting: Surgery

## 2021-02-26 ENCOUNTER — Inpatient Hospital Stay (HOSPITAL_COMMUNITY): Payer: Managed Care, Other (non HMO) | Admitting: Anesthesiology

## 2021-02-26 ENCOUNTER — Other Ambulatory Visit: Payer: Self-pay

## 2021-02-26 ENCOUNTER — Encounter (HOSPITAL_COMMUNITY): Payer: Self-pay | Admitting: Surgery

## 2021-02-26 DIAGNOSIS — F32A Depression, unspecified: Secondary | ICD-10-CM | POA: Diagnosis present

## 2021-02-26 DIAGNOSIS — E785 Hyperlipidemia, unspecified: Secondary | ICD-10-CM | POA: Diagnosis present

## 2021-02-26 DIAGNOSIS — E559 Vitamin D deficiency, unspecified: Secondary | ICD-10-CM | POA: Diagnosis present

## 2021-02-26 DIAGNOSIS — Z79899 Other long term (current) drug therapy: Secondary | ICD-10-CM

## 2021-02-26 DIAGNOSIS — Z20822 Contact with and (suspected) exposure to covid-19: Secondary | ICD-10-CM | POA: Diagnosis present

## 2021-02-26 DIAGNOSIS — Z6841 Body Mass Index (BMI) 40.0 and over, adult: Secondary | ICD-10-CM

## 2021-02-26 HISTORY — PX: UPPER GI ENDOSCOPY: SHX6162

## 2021-02-26 HISTORY — PX: LAPAROSCOPIC GASTRIC SLEEVE RESECTION: SHX5895

## 2021-02-26 LAB — TYPE AND SCREEN
ABO/RH(D): B POS
Antibody Screen: NEGATIVE

## 2021-02-26 LAB — PREGNANCY, URINE: Preg Test, Ur: NEGATIVE

## 2021-02-26 LAB — ABO/RH: ABO/RH(D): B POS

## 2021-02-26 SURGERY — GASTRECTOMY, SLEEVE, LAPAROSCOPIC
Anesthesia: General

## 2021-02-26 MED ORDER — ESMOLOL HCL 100 MG/10ML IV SOLN
INTRAVENOUS | Status: DC | PRN
  Administered 2021-02-26 (×2): 20 mg via INTRAVENOUS

## 2021-02-26 MED ORDER — DEXMEDETOMIDINE HCL 200 MCG/2ML IV SOLN
INTRAVENOUS | Status: DC | PRN
  Administered 2021-02-26: 12 ug via INTRAVENOUS

## 2021-02-26 MED ORDER — ENSURE MAX PROTEIN PO LIQD
2.0000 [oz_av] | ORAL | Status: DC
  Administered 2021-02-28 (×5): 2 [oz_av] via ORAL

## 2021-02-26 MED ORDER — SCOPOLAMINE 1 MG/3DAYS TD PT72
1.0000 | MEDICATED_PATCH | TRANSDERMAL | Status: DC
  Administered 2021-02-26: 1.5 mg via TRANSDERMAL
  Filled 2021-02-26: qty 1

## 2021-02-26 MED ORDER — ONDANSETRON HCL 4 MG/2ML IJ SOLN
INTRAMUSCULAR | Status: DC | PRN
  Administered 2021-02-26: 4 mg via INTRAVENOUS

## 2021-02-26 MED ORDER — LIDOCAINE 2% (20 MG/ML) 5 ML SYRINGE
INTRAMUSCULAR | Status: DC | PRN
  Administered 2021-02-26: 60 mg via INTRAVENOUS

## 2021-02-26 MED ORDER — FENTANYL CITRATE (PF) 100 MCG/2ML IJ SOLN
INTRAMUSCULAR | Status: AC
  Filled 2021-02-26: qty 2

## 2021-02-26 MED ORDER — CHLORHEXIDINE GLUCONATE 4 % EX LIQD: 60.0000 mL | Freq: Once | CUTANEOUS | Status: DC

## 2021-02-26 MED ORDER — LACTATED RINGERS IV SOLN: INTRAVENOUS | Status: DC

## 2021-02-26 MED ORDER — ACETAMINOPHEN 500 MG PO TABS
1000.0000 mg | ORAL_TABLET | Freq: Three times a day (TID) | ORAL | Status: DC
  Administered 2021-02-27 – 2021-02-28 (×3): 1000 mg via ORAL
  Filled 2021-02-26 (×3): qty 2

## 2021-02-26 MED ORDER — ROCURONIUM BROMIDE 10 MG/ML (PF) SYRINGE
PREFILLED_SYRINGE | INTRAVENOUS | Status: DC | PRN
  Administered 2021-02-26: 20 mg via INTRAVENOUS
  Administered 2021-02-26: 70 mg via INTRAVENOUS

## 2021-02-26 MED ORDER — KETOROLAC TROMETHAMINE 30 MG/ML IJ SOLN
30.0000 mg | Freq: Once | INTRAMUSCULAR | Status: DC | PRN
Start: 2021-02-26 — End: 2021-02-26

## 2021-02-26 MED ORDER — MIDAZOLAM HCL 5 MG/5ML IJ SOLN
INTRAMUSCULAR | Status: DC | PRN
  Administered 2021-02-26: 2 mg via INTRAVENOUS

## 2021-02-26 MED ORDER — METOCLOPRAMIDE HCL 5 MG/ML IJ SOLN
10.0000 mg | Freq: Four times a day (QID) | INTRAMUSCULAR | Status: DC
  Administered 2021-02-26 – 2021-02-28 (×7): 10 mg via INTRAVENOUS
  Filled 2021-02-26 (×7): qty 2

## 2021-02-26 MED ORDER — OXYCODONE HCL 5 MG/5ML PO SOLN: 5.0000 mg | Freq: Once | ORAL | Status: DC | PRN

## 2021-02-26 MED ORDER — PROPOFOL 10 MG/ML IV BOLUS
INTRAVENOUS | Status: DC | PRN
  Administered 2021-02-26: 200 mg via INTRAVENOUS

## 2021-02-26 MED ORDER — TRAMADOL HCL 50 MG PO TABS: 50.0000 mg | ORAL_TABLET | Freq: Four times a day (QID) | ORAL | Status: DC | PRN

## 2021-02-26 MED ORDER — PROPOFOL 10 MG/ML IV BOLUS
INTRAVENOUS | Status: AC
  Filled 2021-02-26: qty 20

## 2021-02-26 MED ORDER — PROMETHAZINE HCL 25 MG/ML IJ SOLN: 6.2500 mg | INTRAMUSCULAR | Status: DC | PRN

## 2021-02-26 MED ORDER — ONDANSETRON HCL 4 MG/2ML IJ SOLN
INTRAMUSCULAR | Status: AC
  Filled 2021-02-26: qty 2

## 2021-02-26 MED ORDER — ACETAMINOPHEN 500 MG PO TABS
1000.0000 mg | ORAL_TABLET | ORAL | Status: AC
  Administered 2021-02-26: 1000 mg via ORAL
  Filled 2021-02-26: qty 2

## 2021-02-26 MED ORDER — HYDROMORPHONE HCL 1 MG/ML IJ SOLN: 0.5000 mg | INTRAMUSCULAR | Status: DC | PRN

## 2021-02-26 MED ORDER — FENTANYL CITRATE (PF) 100 MCG/2ML IJ SOLN
INTRAMUSCULAR | Status: DC | PRN
  Administered 2021-02-26 (×4): 50 ug via INTRAVENOUS
  Administered 2021-02-26: 100 ug via INTRAVENOUS

## 2021-02-26 MED ORDER — SODIUM CHLORIDE 0.9 % IV SOLN: INTRAVENOUS | Status: DC

## 2021-02-26 MED ORDER — LIDOCAINE 2% (20 MG/ML) 5 ML SYRINGE
INTRAMUSCULAR | Status: DC | PRN
  Administered 2021-02-26: 1.5 mg/kg/h via INTRAVENOUS

## 2021-02-26 MED ORDER — METOPROLOL TARTRATE 5 MG/5ML IV SOLN: 5.0000 mg | Freq: Four times a day (QID) | INTRAVENOUS | Status: DC | PRN

## 2021-02-26 MED ORDER — METHOCARBAMOL 1000 MG/10ML IJ SOLN
500.0000 mg | Freq: Four times a day (QID) | INTRAVENOUS | Status: DC | PRN
  Filled 2021-02-26: qty 5

## 2021-02-26 MED ORDER — DEXAMETHASONE SODIUM PHOSPHATE 10 MG/ML IJ SOLN
INTRAMUSCULAR | Status: DC | PRN
  Administered 2021-02-26: 5 mg via INTRAVENOUS

## 2021-02-26 MED ORDER — BUPIVACAINE LIPOSOME 1.3 % IJ SUSP
INTRAMUSCULAR | Status: DC | PRN
  Administered 2021-02-26: 20 mL

## 2021-02-26 MED ORDER — OXYCODONE HCL 5 MG/5ML PO SOLN
5.0000 mg | Freq: Four times a day (QID) | ORAL | Status: DC | PRN
Start: 2021-02-26 — End: 2021-02-28
  Administered 2021-02-26: 5 mg via ORAL
  Filled 2021-02-26: qty 5

## 2021-02-26 MED ORDER — ACETAMINOPHEN 160 MG/5ML PO SOLN
1000.0000 mg | Freq: Three times a day (TID) | ORAL | Status: DC
  Administered 2021-02-26 – 2021-02-27 (×2): 1000 mg via ORAL
  Filled 2021-02-26 (×2): qty 40.6

## 2021-02-26 MED ORDER — DOCUSATE SODIUM 100 MG PO CAPS
100.0000 mg | ORAL_CAPSULE | Freq: Two times a day (BID) | ORAL | Status: DC
  Administered 2021-02-26 – 2021-02-28 (×4): 100 mg via ORAL
  Filled 2021-02-26 (×4): qty 1

## 2021-02-26 MED ORDER — SIMETHICONE 80 MG PO CHEW
80.0000 mg | CHEWABLE_TABLET | Freq: Four times a day (QID) | ORAL | Status: DC | PRN
Start: 2021-02-26 — End: 2021-02-28

## 2021-02-26 MED ORDER — SUGAMMADEX SODIUM 200 MG/2ML IV SOLN
INTRAVENOUS | Status: DC | PRN
  Administered 2021-02-26: 300 mg via INTRAVENOUS

## 2021-02-26 MED ORDER — HYDRALAZINE HCL 20 MG/ML IJ SOLN: 10.0000 mg | INTRAMUSCULAR | Status: DC | PRN

## 2021-02-26 MED ORDER — LACTATED RINGERS IR SOLN
Status: DC | PRN
  Administered 2021-02-26: 1000 mL

## 2021-02-26 MED ORDER — ORAL CARE MOUTH RINSE: 15.0000 mL | Freq: Once | OROMUCOSAL | Status: AC

## 2021-02-26 MED ORDER — CHLORHEXIDINE GLUCONATE 0.12 % MT SOLN
15.0000 mL | Freq: Once | OROMUCOSAL | Status: AC
  Administered 2021-02-26: 15 mL via OROMUCOSAL

## 2021-02-26 MED ORDER — BUPIVACAINE-EPINEPHRINE 0.25% -1:200000 IJ SOLN
INTRAMUSCULAR | Status: DC | PRN
  Administered 2021-02-26: 30 mL

## 2021-02-26 MED ORDER — ONDANSETRON HCL 4 MG/2ML IJ SOLN: 4.0000 mg | INTRAMUSCULAR | Status: DC | PRN

## 2021-02-26 MED ORDER — MIDAZOLAM HCL 2 MG/2ML IJ SOLN
INTRAMUSCULAR | Status: AC
  Filled 2021-02-26: qty 2

## 2021-02-26 MED ORDER — BUPIVACAINE-EPINEPHRINE (PF) 0.25% -1:200000 IJ SOLN
INTRAMUSCULAR | Status: AC
  Filled 2021-02-26: qty 30

## 2021-02-26 MED ORDER — LIDOCAINE HCL 2 % IJ SOLN
INTRAMUSCULAR | Status: AC
  Filled 2021-02-26: qty 20

## 2021-02-26 MED ORDER — PANTOPRAZOLE SODIUM 40 MG IV SOLR
40.0000 mg | Freq: Every day | INTRAVENOUS | Status: DC
  Administered 2021-02-26 – 2021-02-27 (×2): 40 mg via INTRAVENOUS
  Filled 2021-02-26 (×2): qty 40

## 2021-02-26 MED ORDER — OXYCODONE HCL 5 MG PO TABS: 5.0000 mg | ORAL_TABLET | Freq: Once | ORAL | Status: DC | PRN

## 2021-02-26 MED ORDER — HEPARIN SODIUM (PORCINE) 5000 UNIT/ML IJ SOLN
5000.0000 [IU] | INTRAMUSCULAR | Status: AC
  Administered 2021-02-26: 5000 [IU] via SUBCUTANEOUS
  Filled 2021-02-26: qty 1

## 2021-02-26 MED ORDER — GABAPENTIN 100 MG PO CAPS
200.0000 mg | ORAL_CAPSULE | Freq: Two times a day (BID) | ORAL | Status: DC
  Administered 2021-02-26 – 2021-02-28 (×4): 200 mg via ORAL
  Filled 2021-02-26 (×4): qty 2

## 2021-02-26 MED ORDER — ENOXAPARIN SODIUM 30 MG/0.3ML IJ SOSY
30.0000 mg | PREFILLED_SYRINGE | Freq: Two times a day (BID) | INTRAMUSCULAR | Status: DC
  Administered 2021-02-27 – 2021-02-28 (×3): 30 mg via SUBCUTANEOUS
  Filled 2021-02-26 (×3): qty 0.3

## 2021-02-26 MED ORDER — 0.9 % SODIUM CHLORIDE (POUR BTL) OPTIME
TOPICAL | Status: DC | PRN
  Administered 2021-02-26: 1000 mL

## 2021-02-26 MED ORDER — STERILE WATER FOR IRRIGATION IR SOLN
Status: DC | PRN
  Administered 2021-02-26: 1000 mL

## 2021-02-26 MED ORDER — FENTANYL CITRATE (PF) 250 MCG/5ML IJ SOLN
INTRAMUSCULAR | Status: AC
  Filled 2021-02-26: qty 5

## 2021-02-26 MED ORDER — GABAPENTIN 300 MG PO CAPS
300.0000 mg | ORAL_CAPSULE | ORAL | Status: AC
Start: 2021-02-27 — End: 2021-02-27
  Administered 2021-02-26: 300 mg via ORAL
  Filled 2021-02-26: qty 1

## 2021-02-26 MED ORDER — APREPITANT 40 MG PO CAPS
40.0000 mg | ORAL_CAPSULE | ORAL | Status: AC
  Administered 2021-02-26: 40 mg via ORAL
  Filled 2021-02-26: qty 1

## 2021-02-26 MED ORDER — FENTANYL CITRATE (PF) 100 MCG/2ML IJ SOLN: 25.0000 ug | INTRAMUSCULAR | Status: DC | PRN

## 2021-02-26 SURGICAL SUPPLY — 63 items
APPLIER CLIP ROT 10 11.4 M/L (STAPLE)
APPLIER CLIP ROT 13.4 12 LRG (CLIP) ×2
BAG COUNTER SPONGE SURGICOUNT (BAG) IMPLANT
BAG LAPAROSCOPIC 12 15 PORT 16 (BASKET) IMPLANT
BAG RETRIEVAL 12/15 (BASKET)
BENZOIN TINCTURE PRP APPL 2/3 (GAUZE/BANDAGES/DRESSINGS) ×2 IMPLANT
BLADE SURG SZ11 CARB STEEL (BLADE) ×2 IMPLANT
BNDG ADH 1X3 SHEER STRL LF (GAUZE/BANDAGES/DRESSINGS) ×2 IMPLANT
CABLE HIGH FREQUENCY MONO STRZ (ELECTRODE) IMPLANT
CHLORAPREP W/TINT 26 (MISCELLANEOUS) ×4 IMPLANT
CLIP APPLIE ROT 10 11.4 M/L (STAPLE) IMPLANT
CLIP APPLIE ROT 13.4 12 LRG (CLIP) ×1 IMPLANT
COVER SURGICAL LIGHT HANDLE (MISCELLANEOUS) ×2 IMPLANT
DECANTER SPIKE VIAL GLASS SM (MISCELLANEOUS) ×2 IMPLANT
DEVICE SUT QUICK LOAD TK 5 (STAPLE) IMPLANT
DEVICE SUT TI-KNOT TK 5X26 (MISCELLANEOUS) IMPLANT
DRAPE UTILITY XL STRL (DRAPES) ×4 IMPLANT
ELECT REM PT RETURN 15FT ADLT (MISCELLANEOUS) ×2 IMPLANT
GAUZE SPONGE 4X4 12PLY STRL (GAUZE/BANDAGES/DRESSINGS) IMPLANT
GLOVE SURG ENC MOIS LTX SZ6 (GLOVE) ×2 IMPLANT
GLOVE SURG MICRO LTX SZ6 (GLOVE) ×2 IMPLANT
GLOVE SURG UNDER LTX SZ6.5 (GLOVE) ×2 IMPLANT
GOWN STRL REUS W/TWL LRG LVL3 (GOWN DISPOSABLE) ×2 IMPLANT
GOWN STRL REUS W/TWL XL LVL3 (GOWN DISPOSABLE) ×8 IMPLANT
GRASPER SUT TROCAR 14GX15 (MISCELLANEOUS) ×2 IMPLANT
KIT BASIN OR (CUSTOM PROCEDURE TRAY) ×2 IMPLANT
KIT TURNOVER KIT A (KITS) ×2 IMPLANT
MARKER SKIN DUAL TIP RULER LAB (MISCELLANEOUS) ×2 IMPLANT
MAT PREVALON FULL STRYKER (MISCELLANEOUS) ×2 IMPLANT
NEEDLE SPNL 22GX3.5 QUINCKE BK (NEEDLE) ×2 IMPLANT
PACK UNIVERSAL I (CUSTOM PROCEDURE TRAY) ×2 IMPLANT
RELOAD ENDO STITCH (ENDOMECHANICALS) IMPLANT
RELOAD STAPLER BLUE 60MM (STAPLE) ×2 IMPLANT
RELOAD STAPLER GOLD 60MM (STAPLE) ×1 IMPLANT
RELOAD STAPLER GREEN 60MM (STAPLE) ×1 IMPLANT
SCISSORS LAP 5X45 EPIX DISP (ENDOMECHANICALS) ×2 IMPLANT
SET IRRIG TUBING LAPAROSCOPIC (IRRIGATION / IRRIGATOR) ×2 IMPLANT
SET TUBE SMOKE EVAC HIGH FLOW (TUBING) ×2 IMPLANT
SHEARS HARMONIC ACE PLUS 45CM (MISCELLANEOUS) ×2 IMPLANT
SLEEVE ADV FIXATION 5X100MM (TROCAR) ×4 IMPLANT
SLEEVE GASTRECTOMY 40FR VISIGI (MISCELLANEOUS) ×2 IMPLANT
SOL ANTI FOG 6CC (MISCELLANEOUS) ×1 IMPLANT
SOLUTION ANTI FOG 6CC (MISCELLANEOUS) ×1
SPONGE T-LAP 18X18 ~~LOC~~+RFID (SPONGE) ×2 IMPLANT
STAPLER ECHELON BIOABSB 60 FLE (MISCELLANEOUS) ×8 IMPLANT
STAPLER ECHELON LONG 60 440 (INSTRUMENTS) ×2 IMPLANT
STAPLER RELOAD BLUE 60MM (STAPLE) ×4
STAPLER RELOAD GOLD 60MM (STAPLE) ×2
STAPLER RELOAD GREEN 60MM (STAPLE) ×2
STRIP CLOSURE SKIN 1/2X4 (GAUZE/BANDAGES/DRESSINGS) ×2 IMPLANT
SUT MNCRL AB 4-0 PS2 18 (SUTURE) ×2 IMPLANT
SUT SURGIDAC NAB ES-9 0 48 120 (SUTURE) IMPLANT
SUT VICRYL 0 TIES 12 18 (SUTURE) ×2 IMPLANT
SYR 10ML ECCENTRIC (SYRINGE) ×2 IMPLANT
SYR 20ML LL LF (SYRINGE) ×2 IMPLANT
SYR 50ML LL SCALE MARK (SYRINGE) IMPLANT
TOWEL OR 17X26 10 PK STRL BLUE (TOWEL DISPOSABLE) ×2 IMPLANT
TOWEL OR NON WOVEN STRL DISP B (DISPOSABLE) ×2 IMPLANT
TROCAR ADV FIXATION 5X100MM (TROCAR) ×2 IMPLANT
TROCAR BLADELESS 15MM (ENDOMECHANICALS) ×2 IMPLANT
TROCAR BLADELESS OPT 5 100 (ENDOMECHANICALS) ×2 IMPLANT
TUBING CONNECTING 10 (TUBING) ×2 IMPLANT
TUBING ENDO SMARTCAP (MISCELLANEOUS) ×2 IMPLANT

## 2021-02-26 NOTE — Discharge Instructions (Signed)

## 2021-02-26 NOTE — Op Note (Signed)
Operative Note  Kathleen Ramos  [id number]  [id number]  02/26/2021   Surgeon: Berna Bue MD   Assistant: Gaynelle Adu MD   Procedure performed: laparoscopic sleeve gastrectomy, upper endoscopy   Preop diagnosis: Morbid obesity Body mass index is 43.85 kg/m. Post-op diagnosis/intraop findings: same   Specimens: fundus Retained items: none  EBL: minimal  Complications: none   Description of procedure: After obtaining informed consent and administration of chemical DVT prophylaxis in holding, the patient was taken to the operating room and placed supine on operating room table where general endotracheal anesthesia was initiated, preoperative antibiotics were administered, SCDs applied, and a formal timeout was performed. The abdomen was prepped and draped in usual sterile fashion. Peritoneal access was gained using a Visiport technique in the left upper quadrant and insufflation to 15 mmHg ensued without issue. Gross inspection revealed no evidence of injury. Under direct visualization three more 5 mm trochars were placed in the right and left hemiabdomen and the 20mm trocar in the right paramedian upper abdomen. Bilateral laparoscopic assisted TAPS blocks were performed with Exparel diluted with 0.25 percent Marcaine with epinephrine. The patient was placed in steep Trendelenburg and the liver retractor was introduced through an incision in the upper midline and secured to the post externally to maintain the left lobe retracted anteriorly.  There is no hiatal hernia on direct inspection. Using the Harmonic scalpel, the greater curvature of the stomach was dissected away from the greater omentum and short gastric vessels were divided. This began 6 cm from the pylorus, and dissection proceeded until the left crus was clearly exposed. The 18 [place] VisiGi was then introduced and directed down towards the pylorus. This was placed to suction against the lesser curve. Serial fires of the linear  cutting stapler were then employed to create our sleeve. The first fire used a green load with a seamguard and ensured adequate room at the angularis incisura. One gold load with seamguard and then several blue loads (the first with seamguard, the rest without) were then employed to create a narrow tubular stomach up to the angle of His. The excised stomach was then removed through our 15 mm trocar site within an Endo Catch bag.  The visigi was taken off of suction and a few puffs of air were introduced, inflating the sleeve. No bubbles were observed in the irrigation fluid around the stomach and the shape was noted to be evenly tubular without any narrowing at the angularis. The visigi was then removed. Upper endoscopy was performed by the assistant surgeon and the sleeve was noted to be airtight, the staple line was hemostatic. Please see his separate note. The endoscope was removed. A small amount of oozing on the staple line was addressed with clips. The 15 mm trocar site fascia in the right upper abdomen was closed with 2 interrupted sutures of 0 Vicryl using the laparoscopic suture passer under direct visualization. The liver retractor was removed under direct visualization. The abdomen was then desufflated and all remaining trochars removed. Subcutaneous bleeding at the 26mm port site was addressed with cautery. The skin incisions were closed with subcuticular 4-0 Monocryl; benzoin, Steri-Strips and Band-Aids were applied The patient was then awakened, extubated and taken to PACU in stable condition.     All counts were correct at the completion of the case.

## 2021-02-26 NOTE — Op Note (Signed)
COBIE LEIDNER [id number] 05/07/1976 02/26/2021  Preoperative diagnosis: severe obesity  Postoperative diagnosis: Same   Procedure: upper endoscopy   Surgeon: Mary Sella. Brandi Tomlinson M.D., FACS   Anesthesia: Gen.   Indications for procedure: 45 y.o. year old female undergoing Laparoscopic Gastric Sleeve Resection and an EGD was requested to evaluate the new gastric sleeve.   Description of procedure: After we have completed the sleeve resection, I scrubbed out and obtained the Olympus endoscope. I gently placed endoscope in the patient's oropharynx and gently glided it down the esophagus without any difficulty under direct visualization. Once I was in the gastric sleeve, I insufflated the stomach with air. I was able to cannulate and advanced the scope through the gastric sleeve. I was able to cannulate the duodenum with ease. Dr. Fredricka Bonine had placed saline in the upper abdomen. Upon further insufflation of the gastric sleeve there was no evidence of bubbles. GE junction located at 38 cm.  Upon further inspection of the gastric sleeve, the mucosa appeared normal. There is no evidence of any mucosal abnormality. No significant retained fundus. The sleeve was widely patent at the angularis. There was no evidence of bleeding. The gastric sleeve was decompressed. The scope was withdrawn. The patient tolerated this portion of the procedure well. Please see Dr Derrill Memo operative note for details regarding the laparoscopic gastric sleeve resection.   Mary Sella. Andrey Campanile, MD, FACS  General, Bariatric, & Minimally Invasive Surgery  [facility], [place]

## 2021-02-26 NOTE — Anesthesia Preprocedure Evaluation (Signed)
Anesthesia Evaluation  Patient identified by MRN, date of birth, ID band Patient awake    Reviewed: Allergy & Precautions, NPO status , Patient's Chart, lab work & pertinent test results  Airway Mallampati: III  TM Distance: <3 FB Neck ROM: Full    Dental no notable dental hx.    Pulmonary sleep apnea ,    Pulmonary exam normal breath sounds clear to auscultation       Cardiovascular negative cardio ROS Normal cardiovascular exam Rhythm:Regular Rate:Normal     Neuro/Psych negative neurological ROS  negative psych ROS   GI/Hepatic negative GI ROS, Neg liver ROS,   Endo/Other  Morbid obesity  Renal/GU negative Renal ROS  negative genitourinary   Musculoskeletal negative musculoskeletal ROS (+)   Abdominal   Peds negative pediatric ROS (+)  Hematology negative hematology ROS (+)   Anesthesia Other Findings   Reproductive/Obstetrics negative OB ROS                             Anesthesia Physical Anesthesia Plan  ASA: 3  Anesthesia Plan: General   Post-op Pain Management:    Induction: Intravenous  PONV Risk Score and Plan: 3 and Ondansetron, Dexamethasone, Midazolam, Treatment may vary due to age or medical condition and Scopolamine patch - Pre-op  Airway Management Planned: Oral ETT  Additional Equipment:   Intra-op Plan:   Post-operative Plan: Extubation in OR  Informed Consent: I have reviewed the patients History and Physical, chart, labs and discussed the procedure including the risks, benefits and alternatives for the proposed anesthesia with the patient or authorized representative who has indicated his/her understanding and acceptance.     Dental advisory given  Plan Discussed with: CRNA and Surgeon  Anesthesia Plan Comments:         Anesthesia Quick Evaluation

## 2021-02-26 NOTE — Progress Notes (Signed)
PHARMACY CONSULT FOR:  Risk Assessment for Post-Discharge VTE Following Bariatric Surgery  Post-Discharge VTE Risk Assessment: This patient's probability of 30-day post-discharge VTE is increased due to the factors marked:   Female    Age >/=60 years    BMI >/=50 kg/m2    CHF    Dyspnea at Rest    Paraplegia  x  Non-gastric-band surgery    Operation Time >/=3 hr    Return to OR     Length of Stay >/= 3 d   Hx of VTE   Hypercoagulable condition   Significant venous stasis       Predicted probability of 30-day post-discharge VTE: 0.16%  Other patient-specific factors to consider: n/a   Recommendation for Discharge: No pharmacologic prophylaxis post-discharge     Kathleen Ramos is a 45 y.o. female who underwent laparoscopic sleeve gastrectomy on 02/26/21   Case start: 1404 Case end: 1509   No Known Allergies  Patient Measurements: Height: 5\' 7"  (170.2 cm) Weight: 127 kg (279 lb 15.8 oz) IBW/kg (Calculated) : 61.6 Body mass index is 43.85 kg/m.  No results for input(s): WBC, HGB, HCT, PLT, APTT, CREATININE, LABCREA, CREATININE, CREAT24HRUR, MG, PHOS, ALBUMIN, PROT, ALBUMIN, AST, ALT, ALKPHOS, BILITOT, BILIDIR, IBILI in the last 72 hours. Estimated Creatinine Clearance: 125.7 mL/min (by C-G formula based on SCr of 0.54 mg/dL).    Past Medical History:  Diagnosis Date   Anemia    Arthritis    Complication of anesthesia    hard to wake up 2018   Sleep apnea      Medications Prior to Admission  Medication Sig Dispense Refill Last Dose   Multiple Vitamin (MULTIVITAMIN WITH MINERALS) TABS tablet Take 1 tablet by mouth daily.   02/25/2021 at 0900   B Complex-C-Folic Acid (B COMPLEX-VITAMIN C-FOLIC ACID) 1 MG tablet Take 1 tablet by mouth daily with breakfast. (Patient not taking: Reported on 02/21/2021) 90 tablet 1 Not Taking   drospirenone-ethinyl estradiol (YAZ) 3-0.02 MG tablet Take 1 tablet by mouth daily. 84 tablet 0    Vitamin D, Ergocalciferol, (DRISDOL)  1.25 MG (50000 UNIT) CAPS capsule Take 1 capsule (50,000 Units total) by mouth every 7 (seven) days. (Patient not taking: Reported on 02/21/2021) 4 capsule 0 Not Taking       02/23/2021 02/26/2021,5:02 PM

## 2021-02-26 NOTE — Anesthesia Postprocedure Evaluation (Signed)
Anesthesia Post Note  Patient: Kathleen Ramos  Procedure(s) Performed: LAPAROSCOPIC GASTRIC SLEEVE RESECTION UPPER GI ENDOSCOPY     Patient location during evaluation: PACU Anesthesia Type: General Level of consciousness: awake and alert Pain management: pain level controlled Vital Signs Assessment: post-procedure vital signs reviewed and stable Respiratory status: spontaneous breathing, nonlabored ventilation, respiratory function stable and patient connected to nasal cannula oxygen Cardiovascular status: blood pressure returned to baseline and stable Postop Assessment: no apparent nausea or vomiting Anesthetic complications: no   No notable events documented.  Last Vitals:  Vitals:   02/26/21 1615 02/26/21 1639  BP: (!) 144/74 134/85  Pulse: 88 83  Resp: (!) 22 17  Temp: 36.6 C 36.8 C  SpO2: 94% 100%    Last Pain:  Vitals:   02/26/21 1639  TempSrc: Oral  PainSc:                  Kathleen Ramos S

## 2021-02-26 NOTE — Progress Notes (Signed)
Patient has started water. She is currently sitting in chair and has walked to bathroom, she is voiding. Patient c/o 6/10 abdominal pain and not ready to attempt scheduled tylenol. She is coughing up moderate amounts of phlegm and dry heaving some. RN offered antiemetic but patient stated they did not need it at this time. Patient is on scheduled reglan. RN made patient aware that another dose is scheduled for later in the night. IS done. Patient needs to walk in hallway when more awake.

## 2021-02-26 NOTE — Interval H&P Note (Signed)
History and Physical Interval Note:  02/26/2021 12:43 PM  Kathleen Ramos  has presented today for surgery, with the diagnosis of MORBID OBESITY.  The various methods of treatment have been discussed with the patient and family. After consideration of risks, benefits and other options for treatment, the patient has consented to  Procedure(s): LAPAROSCOPIC GASTRIC SLEEVE RESECTION (N/A) UPPER GI ENDOSCOPY (N/A) as a surgical intervention.  The patient's history has been reviewed, patient examined, no change in status, stable for surgery.  I have reviewed the patient's chart and labs.  Questions were answered to the patient's satisfaction.     Trenten Watchman Lollie Sails

## 2021-02-26 NOTE — Transfer of Care (Signed)
Immediate Anesthesia Transfer of Care Note  Patient: Kathleen Ramos  Procedure(s) Performed: LAPAROSCOPIC GASTRIC SLEEVE RESECTION UPPER GI ENDOSCOPY  Patient Location: PACU  Anesthesia Type:General  Level of Consciousness: sedated, patient cooperative and responds to stimulation  Airway & Oxygen Therapy: Patient Spontanous Breathing and Patient connected to face mask oxygen  Post-op Assessment: Report given to RN and Post -op Vital signs reviewed and stable  Post vital signs: Reviewed and stable  Last Vitals:  Vitals Value Taken Time  BP 148/67 02/26/21 1519  Temp    Pulse 85 02/26/21 1522  Resp 25 02/26/21 1522  SpO2 96 % 02/26/21 1522  Vitals shown include unvalidated device data.  Last Pain:  Vitals:   02/26/21 1214  TempSrc: Oral         Complications: No notable events documented.

## 2021-02-26 NOTE — Anesthesia Procedure Notes (Signed)
Procedure Name: Intubation Date/Time: 02/26/2021 2:05 PM Performed by: Gean Maidens, CRNA Pre-anesthesia Checklist: Patient identified, Emergency Drugs available, Suction available, Patient being monitored and Timeout performed Patient Re-evaluated:Patient Re-evaluated prior to induction Oxygen Delivery Method: Circle system utilized Preoxygenation: Pre-oxygenation with 100% oxygen Induction Type: IV induction Ventilation: Mask ventilation without difficulty Laryngoscope Size: Mac and 4 Grade View: Grade I Tube type: Oral Tube size: 7.0 mm Number of attempts: 1 Airway Equipment and Method: Stylet Placement Confirmation: ETT inserted through vocal cords under direct vision, positive ETCO2 and breath sounds checked- equal and bilateral Secured at: 23 cm Tube secured with: Tape Dental Injury: Teeth and Oropharynx as per pre-operative assessment

## 2021-02-26 NOTE — Progress Notes (Signed)

## 2021-02-27 ENCOUNTER — Other Ambulatory Visit (HOSPITAL_COMMUNITY): Payer: Self-pay

## 2021-02-27 ENCOUNTER — Encounter (HOSPITAL_COMMUNITY): Payer: Self-pay | Admitting: Surgery

## 2021-02-27 LAB — CBC WITH DIFFERENTIAL/PLATELET
Abs Immature Granulocytes: 0.03 10*3/uL (ref 0.00–0.07)
Basophils Absolute: 0 10*3/uL (ref 0.0–0.1)
Basophils Relative: 0 %
Eosinophils Absolute: 0 10*3/uL (ref 0.0–0.5)
Eosinophils Relative: 0 %
HCT: 37.6 % (ref 36.0–46.0)
Hemoglobin: 11.5 g/dL — ABNORMAL LOW (ref 12.0–15.0)
Immature Granulocytes: 0 %
Lymphocytes Relative: 8 %
Lymphs Abs: 0.8 10*3/uL (ref 0.7–4.0)
MCH: 26.3 pg (ref 26.0–34.0)
MCHC: 30.6 g/dL (ref 30.0–36.0)
MCV: 86 fL (ref 80.0–100.0)
Monocytes Absolute: 0.3 10*3/uL (ref 0.1–1.0)
Monocytes Relative: 3 %
Neutro Abs: 8.5 10*3/uL — ABNORMAL HIGH (ref 1.7–7.7)
Neutrophils Relative %: 89 %
Platelets: 249 10*3/uL (ref 150–400)
RBC: 4.37 MIL/uL (ref 3.87–5.11)
RDW: 15.9 % — ABNORMAL HIGH (ref 11.5–15.5)
WBC: 9.6 10*3/uL (ref 4.0–10.5)
nRBC: 0 % (ref 0.0–0.2)

## 2021-02-27 LAB — MAGNESIUM: Magnesium: 1.9 mg/dL (ref 1.7–2.4)

## 2021-02-27 LAB — SURGICAL PATHOLOGY

## 2021-02-27 LAB — COMPREHENSIVE METABOLIC PANEL
ALT: 30 U/L (ref 0–44)
AST: 35 U/L (ref 15–41)
Albumin: 3.8 g/dL (ref 3.5–5.0)
Alkaline Phosphatase: 52 U/L (ref 38–126)
Anion gap: 10 (ref 5–15)
BUN: 7 mg/dL (ref 6–20)
CO2: 23 mmol/L (ref 22–32)
Calcium: 9 mg/dL (ref 8.9–10.3)
Chloride: 102 mmol/L (ref 98–111)
Creatinine, Ser: 0.39 mg/dL — ABNORMAL LOW (ref 0.44–1.00)
GFR, Estimated: >60 mL/min (ref >60)
Glucose, Bld: 139 mg/dL — ABNORMAL HIGH (ref 70–99)
Potassium: 4 mmol/L (ref 3.5–5.1)
Sodium: 135 mmol/L (ref 135–145)
Total Bilirubin: 0.6 mg/dL (ref 0.3–1.2)
Total Protein: 7.5 g/dL (ref 6.5–8.1)

## 2021-02-27 MED ORDER — GABAPENTIN 100 MG PO CAPS
200.0000 mg | ORAL_CAPSULE | Freq: Two times a day (BID) | ORAL | 0 refills | Status: DC
  Filled 2021-02-27: qty 20, 5d supply, fill #0

## 2021-02-27 MED ORDER — ACETAMINOPHEN 500 MG PO TABS: 1000.0000 mg | ORAL_TABLET | Freq: Three times a day (TID) | ORAL | 0 refills | Status: AC

## 2021-02-27 MED ORDER — ONDANSETRON 4 MG PO TBDP
4.0000 mg | ORAL_TABLET | Freq: Four times a day (QID) | ORAL | 0 refills | Status: DC | PRN
  Filled 2021-02-27: qty 20, 5d supply, fill #0

## 2021-02-27 MED ORDER — PANTOPRAZOLE SODIUM 40 MG PO TBEC
40.0000 mg | DELAYED_RELEASE_TABLET | Freq: Every day | ORAL | 0 refills | Status: DC
  Filled 2021-02-27: qty 30, 30d supply, fill #0

## 2021-02-27 MED ORDER — TRAMADOL HCL 50 MG PO TABS
50.0000 mg | ORAL_TABLET | Freq: Four times a day (QID) | ORAL | 0 refills | Status: DC | PRN
  Filled 2021-02-27: qty 10, 3d supply, fill #0

## 2021-02-27 MED ORDER — DOCUSATE SODIUM 100 MG PO CAPS
100.0000 mg | ORAL_CAPSULE | Freq: Two times a day (BID) | ORAL | 0 refills | Status: AC
  Filled 2021-02-27: qty 30, 15d supply, fill #0

## 2021-02-27 NOTE — Progress Notes (Signed)
Discussed ambulation with the patient.  She does not feel as though she can walk much at this time.  Encouraged her that it will help speed recovery and help to increase GI motility.

## 2021-02-27 NOTE — Progress Notes (Signed)
S: Had a decent night. Denies nausea. Some mid-abdominal soreness. Walked halls this morning. Tolerating sips of clears without issue.   O: Vitals, labs, intake/output, and orders reviewed at this time. Tmax=Tcurrent 99.9, HR 71-94, normotensive, 96% room air. PO 120, UOP 2400 + 1x. CMP unremarkable. WBC 9.6 (6.1 preop), hgb 11.5 (12.3).    Gen: A&Ox3, no distress  H&N: EOMI, atraumatic, neck supple Chest: unlabored respirations, RRR Abd: soft, nontender, nondistended, incision(s) c/d/i with steri strips, no cellulitis or hematoma Ext: warm, no edema Neuro: grossly normal  Lines/tubes/drains: PIV  A/P: POD 1 sleeve gastrectomy Continue clears, protein shakes Continue SCDs, ambulate, prophylactic lovenox, pulmonary toilet Plan discharge later today pending progress with PO intake.   Phylliss Blakes, MD [facility], [place]

## 2021-02-27 NOTE — Progress Notes (Signed)
Patient alert and oriented, pain is controlled. Patient is tolerating fluids, advanced to protein shake today, patient is tolerating well.  Reviewed Gastric sleeve discharge instructions with patient and patient is able to articulate understanding.  Provided information on BELT program, Support Group and [facility] outpatient pharmacy. All questions answered, will continue to monitor.  

## 2021-02-27 NOTE — Progress Notes (Signed)
Nutrition Education Note ° °Received consult for diet education for patient s/p bariatric surgery. ° °Discussed 2 week post op diet with pt. Emphasized that liquids must be non carbonated, non caffeinated, and sugar free. Fluid goals discussed. Pt to follow up with outpatient bariatric RD for further diet progression after 2 weeks. Multivitamins and minerals also reviewed. Teach back method used, pt expressed understanding, expect good compliance. ° °If nutrition issues arise, please consult RD. ° °[place]leigh Edmonston, MS, RD, LDN °Inpatient Clinical Dietitian °Contact information available via Amion ° ° °

## 2021-02-27 NOTE — Plan of Care (Signed)
Plan of care reviewed and discussed with the patient.  Ambulation reviewed and encouraged.

## 2021-02-28 MED ORDER — SODIUM CHLORIDE 0.9 % IV SOLN: INTRAVENOUS | Status: DC

## 2021-02-28 NOTE — Progress Notes (Signed)
S: Did better with PO overnight. Epigastric pain as expected. Using timer to remind herself to drink. Walking halls. Has required no PRNs since day of surgery.   O: Vitals, labs, intake/output, and orders reviewed at this time. Afebrile, HR 70s, normotensive. Sat 97% room air. PO 330, 210 of which was overnight. UOP 2400   Gen: A&Ox3, no distress  H&N: EOMI, atraumatic, neck supple Chest: unlabored respirations, RRR Abd: soft, nontender, nondistended, incision(s) c/d/i with no cellulitis or hematoma Ext: warm, no edema Neuro: grossly normal  Lines/tubes/drains: PIV  A/P: POD 2 sleeve gastrectomy Decrease IVF Continue clears/ protein shakes- PO intake improving Discharge home today.   Phylliss Blakes, MD [facility], [place]

## 2021-02-28 NOTE — Plan of Care (Signed)
  Problem: Education: Goal: Knowledge of General Education information will improve Description: Including pain rating scale, medication(s)/side effects and non-pharmacologic comfort measures Outcome: Adequate for Discharge   Problem: Health Behavior/Discharge Planning: Goal: Ability to manage health-related needs will improve Outcome: Adequate for Discharge   Problem: Clinical Measurements: Goal: Ability to maintain clinical measurements within normal limits will improve Outcome: Adequate for Discharge Goal: Will remain free from infection Outcome: Adequate for Discharge Goal: Diagnostic test results will improve Outcome: Adequate for Discharge Goal: Respiratory complications will improve Outcome: Adequate for Discharge Goal: Cardiovascular complication will be avoided Outcome: Adequate for Discharge   Problem: Activity: Goal: Risk for activity intolerance will decrease Outcome: Adequate for Discharge   Problem: Nutrition: Goal: Adequate nutrition will be maintained Outcome: Adequate for Discharge   Problem: Coping: Goal: Level of anxiety will decrease Outcome: Adequate for Discharge   Problem: Elimination: Goal: Will not experience complications related to bowel motility Outcome: Adequate for Discharge   Problem: Pain Managment: Goal: General experience of comfort will improve Outcome: Adequate for Discharge   Problem: Safety: Goal: Ability to remain free from injury will improve Outcome: Adequate for Discharge   Problem: Skin Integrity: Goal: Risk for impaired skin integrity will decrease Outcome: Adequate for Discharge   Problem: Education: Goal: Knowledge of the prescribed self-care regimen will improve Outcome: Adequate for Discharge Goal: Knowledge of discharge needs will improve Outcome: Adequate for Discharge   Problem: Activity: Goal: Ability to tolerate increased activity will improve Outcome: Adequate for Discharge   Problem:  Bowel/Gastric: Goal: Gastrointestinal status for postoperative course will improve Outcome: Adequate for Discharge Goal: Occurrences of nausea will decrease Outcome: Adequate for Discharge   Problem: Coping: Goal: Development of coping mechanisms to deal with changes in body function or appearance will improve Outcome: Adequate for Discharge   Problem: Fluid Volume: Goal: Maintenance of adequate hydration will improve Outcome: Adequate for Discharge   Problem: Nutritional: Goal: Nutritional status will improve Outcome: Adequate for Discharge   Problem: Clinical Measurements: Goal: Will show no signs or symptoms of venous thromboembolism Outcome: Adequate for Discharge Goal: Will remain free from infection Outcome: Adequate for Discharge Goal: Will show no signs of GI Leak Outcome: Adequate for Discharge   Problem: Respiratory: Goal: Will regain and/or maintain adequate ventilation Outcome: Adequate for Discharge   Problem: Pain Management: Goal: Pain level will decrease Outcome: Adequate for Discharge   Problem: Skin Integrity: Goal: Demonstration of wound healing without infection will improve Outcome: Adequate for Discharge

## 2021-02-28 NOTE — Progress Notes (Signed)
Assessment unchanged. Finished protein shake. Pt verbalized understanding of dc instructions concerning follow up care and when to call the doctor. Verbalized understanding of Crystal, Bariatric Coordinator's  instructions regarding diet and medications. Discharged via foot per request to front entrance accompanied by NT and husband.

## 2021-02-28 NOTE — Progress Notes (Signed)
Patient alert and oriented, Post op day 2.  Provided support and encouragement.  Encouraged pulmonary toilet, ambulation and small sips of liquids.  All questions answered.  Will continue to monitor. Plan to discharge later today.

## 2021-02-28 NOTE — Plan of Care (Signed)
Plan of care reviewed and discussed with the patient. 

## 2021-03-01 NOTE — Discharge Summary (Signed)
Physician Discharge Summary  Kathleen Ramos ERD:[id number] DOB: [birth year] DOA: 02/26/2021  PCP: Lorrene Reid, PA-C  Admit date: 02/26/2021 Discharge date: 02/28/2021  Recommendations for Outpatient Follow-up:    Follow-up Information     Clovis Riley, MD. Go on 03/21/2021.   Specialty: General Surgery Why: at 9:20am.  Please arrive 15 minutes prior to your appointment time.  Thank you. Contact information: [address] [place] [address] [phone]         Surgery, [place]. Go on 04/19/2021.   Specialty: General Surgery Why: at 9:20am with Dr. Kae Heller.  Please arrive 15 minutes prior to your appointment time.  Thank you. Contact information: [place] [address] [phone]                Discharge Diagnoses:  Active Problems:   Morbid obesity ([place])   Surgical Procedure: Laparoscopic Sleeve Gastrectomy, upper endoscopy  Discharge Condition: Good Disposition: Home  Diet recommendation: Postoperative sleeve gastrectomy diet (liquids only)  Filed Weights   02/26/21 1214 02/26/21 1644  Weight: 127 kg 127 kg     Hospital Course:  The patient was admitted for a planned laparoscopic sleeve gastrectomy. Please see operative note. Preoperatively the patient was given 5000 units of subcutaneous heparin for DVT prophylaxis. Postoperative prophylactic Lovenox dosing was started on the evening of postoperative day 0. ERAS protocol was used. On the evening of postoperative day 0, the patient was started on water and ice chips. On postoperative day 1 the patient had no fever or tachycardia and was tolerating water in their diet was gradually advanced throughout the day. The patient was ambulating without difficulty. Their vital signs are stable without fever or tachycardia. Their hemoglobin had remained stable. One post-op day 2 her PO intake was improving and was sufficient. The patient had received discharge  instructions and counseling. They were deemed stable for discharge and had met discharge criteria   Discharge Instructions   Allergies as of 02/28/2021   No Known Allergies      Medication List     STOP taking these medications    B complex-vitamin C-folic acid 1 MG tablet   drospirenone-ethinyl estradiol 3-0.02 MG tablet Commonly known as: YAZ   Vitamin D (Ergocalciferol) 1.25 MG (50000 UNIT) Caps capsule Commonly known as: DRISDOL       TAKE these medications    acetaminophen 500 MG tablet Commonly known as: TYLENOL Take 2 tablets (1,000 mg total) by mouth every 8 (eight) hours for 5 days.   docusate sodium 100 MG capsule Commonly known as: Colace Take 1 capsule (100 mg total) by mouth 2 (two) times daily. Okay to decrease to once daily or stop taking if having loose bowel movements   gabapentin 100 MG capsule Commonly known as: NEURONTIN Take 2 capsules (200 mg total) by mouth every 12 (twelve) hours.   multivitamin with minerals Tabs tablet Take 1 tablet by mouth daily.   ondansetron 4 MG disintegrating tablet Commonly known as: ZOFRAN-ODT Dissolve 1 tablet (4 mg total) by mouth every 6 (six) hours as needed for nausea or vomiting.   pantoprazole 40 MG tablet Commonly known as: PROTONIX Take 1 tablet (40 mg total) by mouth daily.   traMADol 50 MG tablet Commonly known as: ULTRAM Take 1 tablet (50 mg total) by mouth every 6 (six) hours as needed (pain).        Follow-up Information     Clovis Riley, MD. Go on  03/21/2021.   Specialty: General Surgery Why: at 9:20am.  Please arrive 15 minutes prior to your appointment time.  Thank you. Contact information: [address] [place] [address] [phone]         Surgery, [place]. Go on 04/19/2021.   Specialty: General Surgery Why: at 9:20am with Dr. Kae Heller.  Please arrive 15 minutes prior to your appointment time.  Thank you. Contact information: [place] [address] [phone]                  The results of significant diagnostics from this hospitalization (including imaging, microbiology, ancillary and laboratory) are listed below for reference.    Significant Diagnostic Studies: No results found.  Labs: Basic Metabolic Panel: Recent Labs  Lab 02/22/21 1021 02/27/21 0421  NA 138 135  K 4.0 4.0  CL 105 102  CO2 23 23  GLUCOSE 114* 139*  BUN 12 7  CREATININE 0.54 0.39*  CALCIUM 9.1 9.0  MG  --  1.9   Liver Function Tests: Recent Labs  Lab 02/22/21 1021 02/27/21 0421  AST 18 35  ALT 12 30  ALKPHOS 58 52  BILITOT 0.3 0.6  PROT 7.3 7.5  ALBUMIN 3.8 3.8    CBC: Recent Labs  Lab 02/22/21 1021 02/27/21 0421  WBC 6.1 9.6  NEUTROABS 3.1 8.5*  HGB 12.3 11.5*  HCT 39.5 37.6  MCV 85.5 86.0  PLT 245 249    CBG: No results for input(s): GLUCAP in the last 168 hours.  Active Problems:   Morbid obesity ([place])    Signed:  [facility], [place] 03/01/2021, 7:31 AM

## 2021-03-01 NOTE — Progress Notes (Signed)
24hr fluid recall prior to discharge: .  Per dehydration protocol, will call to f/u with pt within one week post op.

## 2021-03-04 ENCOUNTER — Telehealth (HOSPITAL_COMMUNITY): Payer: Self-pay | Admitting: *Deleted

## 2021-03-04 NOTE — Telephone Encounter (Signed)
1.  Tell me about your pain and pain management? Pt denies any current pain.  Pt states that she has some soreness especially when sitting or moving.  Pt instructed to take pain medication as needed and to split discomfort with a pillow when changing positions.  2.  Let's talk about fluid intake.  How much total fluid are you taking in? Pt states that she is getting in more than 64oz of fluid including protein shakes, bottled water, broth and popsicles.  3.  How much protein have you taken in the last 2 days? Pt states she is meeting her goal of 60g of protein each day with the protein shakes.  4.  Have you had nausea?  Tell me about when have experienced nausea and what you did to help? Pt denies nausea.   5.  Has the frequency or color changed with your urine? Pt states that she is urinating "fine" with no changes in frequency or urgency.     6.  Tell me what your incisions look like? "Incisions look fine". Pt denies a fever, chills.  Pt states incisions are not swollen, open, or draining.  Pt encouraged to call [facility] if incisions change.   7.  Have you been passing gas? BM? Pt states that she is having BMs. Last BM 03/03/21.     8.  If a problem or question were to arise who would you call?  Do you know contact numbers for [facility], [facility], and [facility]? Pt denies dehydration symptoms.  Pt can describe s/sx of dehydration.  Pt knows to call [facility] for surgical, [facility] for nutrition, and [facility] for non-urgent questions or concerns.   9.  How has the walking going? Pt states she is walking around and able to be active without difficulty.   10. Are you still using your incentive spirometer?  If so, how often? Pt does not have I.S. at home to use (forgot it at the hospital).  Pt encouraged to practice TCDB exercises and the "purse lip" breathing technique to simulate using an I.S. at least 10x every hour while awake until she sees the surgeon.  11.  How are your vitamins and calcium going?  How are you taking  them? Pt states that she is taking her supplements and vitamins without difficulty.  Reminded patient that the first 30 days post-operatively are important for successful recovery.  Practice good hand hygiene, wearing a mask when appropriate (since optional in most places), and minimizing exposure to people who live outside of the home, especially if they are exhibiting any respiratory, GI, or illness-like symptoms.

## 2021-03-12 ENCOUNTER — Encounter: Payer: Managed Care, Other (non HMO) | Attending: Surgery | Admitting: Skilled Nursing Facility1

## 2021-03-12 ENCOUNTER — Other Ambulatory Visit: Payer: Self-pay

## 2021-03-13 NOTE — Progress Notes (Signed)
2 Week Post-Operative Nutrition Class   Patient was seen on 03/12/2021 for Post-Operative Nutrition education at the [facility].    Surgery date: 02/26/2021 Surgery type: sleeve Start weight at [facility]: 293 Weight today: 263.8 pounds Bowel Habits: Every day to every other day no complaints   Body Composition Scale 03/12/2021  Current Body Weight 263.8  Total Body Fat % 45.6  Visceral Fat 14  Fat-Free Mass % 54.3   Total Body Water % 41.6  Muscle-Mass lbs 33.2  BMI 41.1  Body Fat Displacement          Torso  lbs 74.6         Left Leg  lbs 14.9         Right Leg  lbs 14.9         Left Arm  lbs 7.4         Right Arm   lbs 7.4      The following the learning objectives were met by the patient during this course: Identifies Phase 3 (Soft, High Proteins) Dietary Goals and will begin from 2 weeks post-operatively to 2 months post-operatively Identifies appropriate sources of fluids and proteins  Identifies appropriate fat sources and healthy verses unhealthy fat types   States protein recommendations and appropriate sources post-operatively Identifies the need for appropriate texture modifications, mastication, and bite sizes when consuming solids Identifies appropriate fat consumption and sources Identifies appropriate multivitamin and calcium sources post-operatively Describes the need for physical activity post-operatively and will follow MD recommendations States when to call healthcare provider regarding medication questions or post-operative complications   Handouts given during class include: Phase 3A: Soft, High Protein Diet Handout Phase 3 High Protein Meals Healthy Fats   Follow-Up Plan: Patient will follow-up at [facility] in 6 weeks for 2 month post-op nutrition visit for diet advancement per MD.

## 2021-03-18 ENCOUNTER — Telehealth: Payer: Self-pay | Admitting: Skilled Nursing Facility1

## 2021-03-18 NOTE — Telephone Encounter (Signed)
RD called pt to verify fluid intake once starting soft, solid proteins 2 week post-bariatric surgery.   Daily Fluid intake: Daily Protein intake: Bowel habits:  Concerns/issues:    LVM

## 2021-03-21 DIAGNOSIS — R7303 Prediabetes: Secondary | ICD-10-CM | POA: Insufficient documentation

## 2021-03-21 DIAGNOSIS — E785 Hyperlipidemia, unspecified: Secondary | ICD-10-CM | POA: Insufficient documentation

## 2021-03-21 DIAGNOSIS — Z8659 Personal history of other mental and behavioral disorders: Secondary | ICD-10-CM | POA: Insufficient documentation

## 2021-03-21 DIAGNOSIS — G4733 Obstructive sleep apnea (adult) (pediatric): Secondary | ICD-10-CM | POA: Insufficient documentation

## 2021-03-21 DIAGNOSIS — D649 Anemia, unspecified: Secondary | ICD-10-CM | POA: Insufficient documentation

## 2021-04-16 ENCOUNTER — Encounter: Payer: Managed Care, Other (non HMO) | Admitting: Physician Assistant

## 2021-04-25 ENCOUNTER — Ambulatory Visit: Payer: Managed Care, Other (non HMO) | Admitting: Skilled Nursing Facility1

## 2021-05-01 ENCOUNTER — Ambulatory Visit: Payer: Managed Care, Other (non HMO) | Admitting: Skilled Nursing Facility1

## 2021-05-04 IMAGING — RF DG UGI W SINGLE CM
9 of 11 series · 12 of 24 positions shown · non-contrast
Comparison: None.

CLINICAL DATA: Preoperative for bariatric surgery, morbid obesity.

EXAM:
UPPER GI SERIES WITH KUB
TECHNIQUE: After obtaining a scout radiograph a routine upper GI series was
performed using thin barium
FLUOROSCOPY TIME:  Fluoroscopy Time:  3 minutes, 12 seconds
Radiation Exposure Index (if provided by the fluoroscopic device):
74.1 mGy
Number of Acquired Spot Images: 4

[Series 2: cp_standard · 0.34mm/px · 2 of 39 frames shown (1 of 7)]
[frame 6/39]
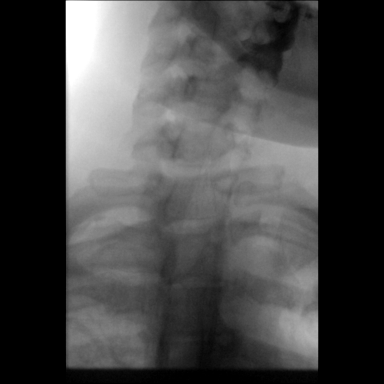
[frame 37/39]
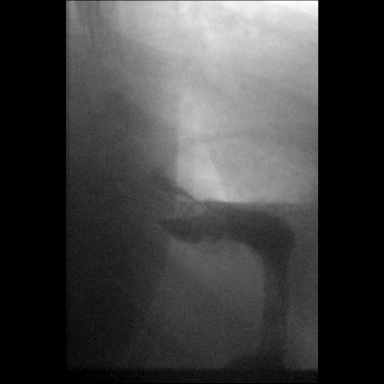

[Series 3: cp_standard · 0.35mm/px · 1 of 56 frames shown (2 of 7)]
[frame 31/56]
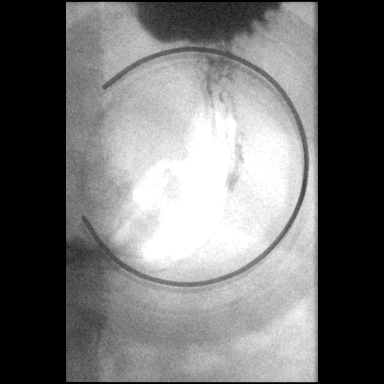

[Series 4: fluoro_barium 2fps_bw · 0.17mm/px · 1 of 1 slices shown (1 of 2)]
[im 1/1]
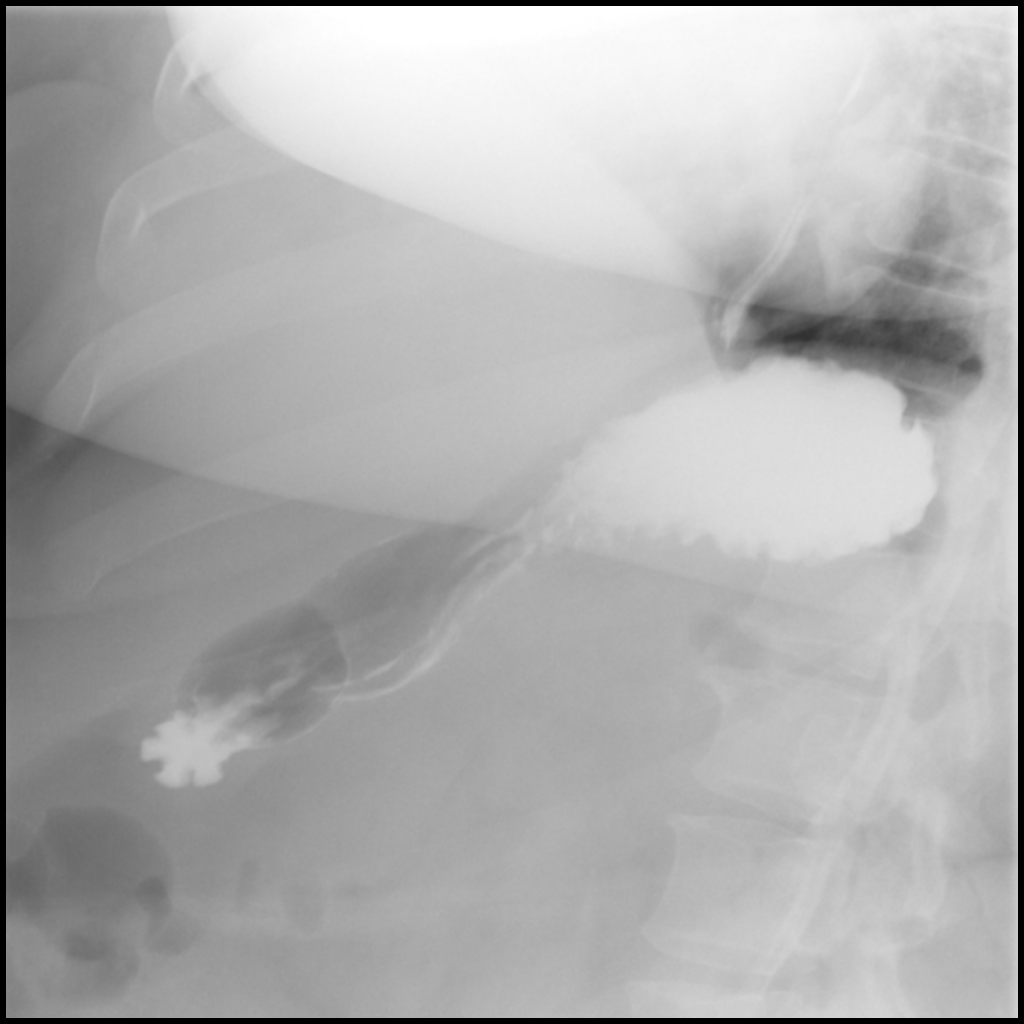

[Series 7: fluoro_barium 2fps_bw · 0.18mm/px · 1 of 1 slices shown (2 of 2)]
[im 1/1]
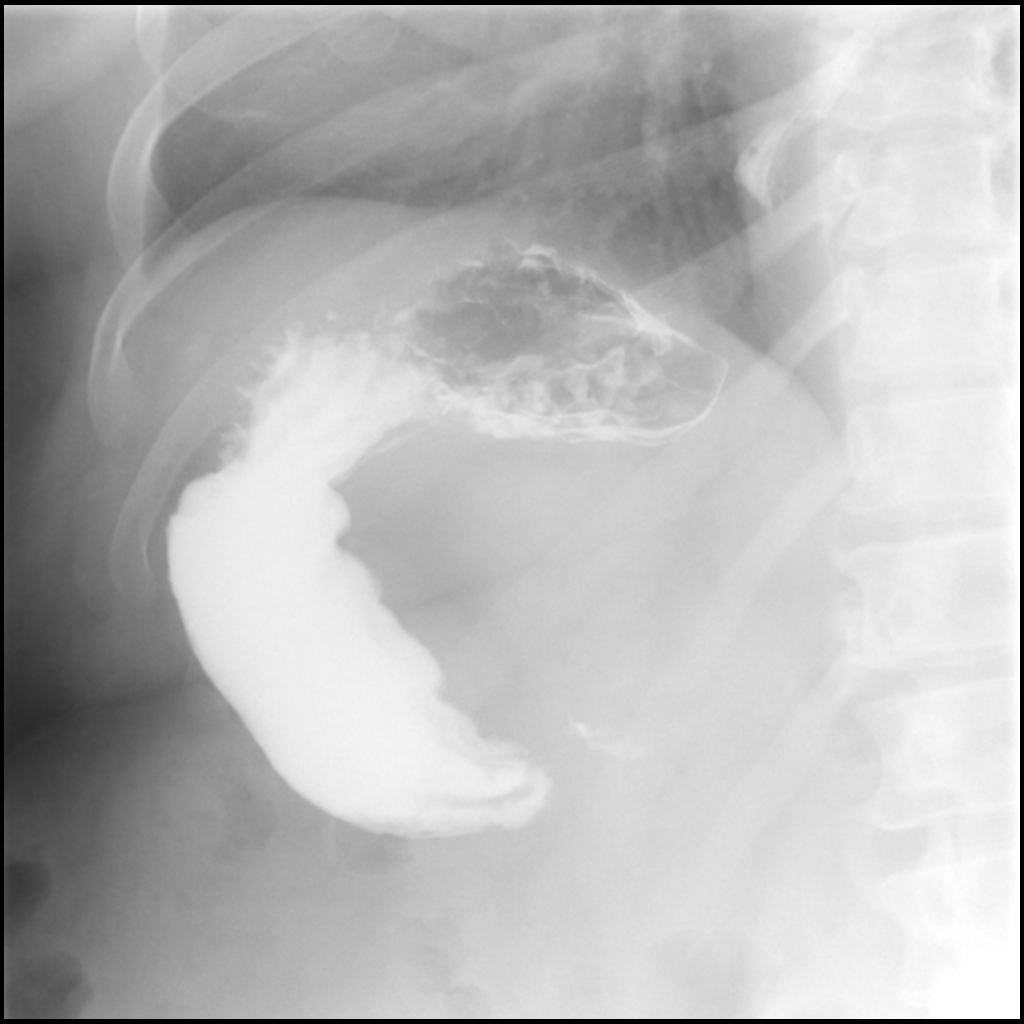

[Series 8: cp_standard · 0.36mm/px · 1 of 30 frames shown (3 of 7)]
[frame 26/30]
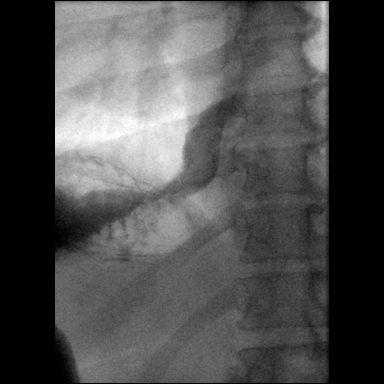

[Series 9: cp_standard · 0.36mm/px · 2 of 37 frames shown (4 of 7)]
[frame 19/37]
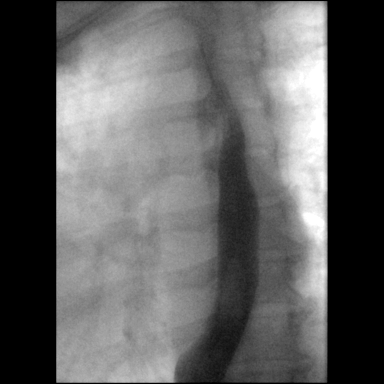
[frame 37/37]
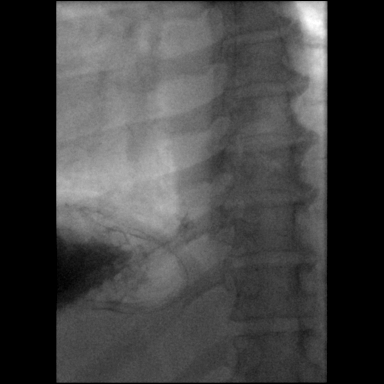

[Series 10: cp_standard · 0.35mm/px · 1 of 19 frames shown (5 of 7)]
[frame 10/19]
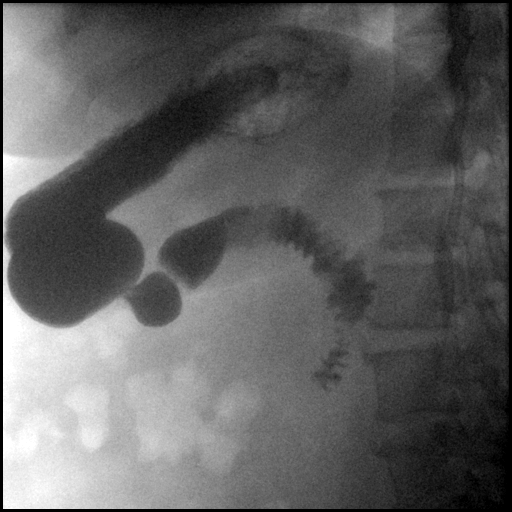

[Series 11: cp_standard · 0.35mm/px · 2 of 28 frames shown (6 of 7)]
[frame 5/28]
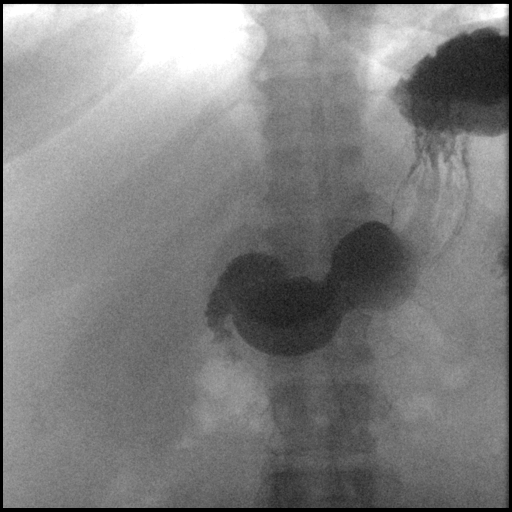
[frame 24/28]
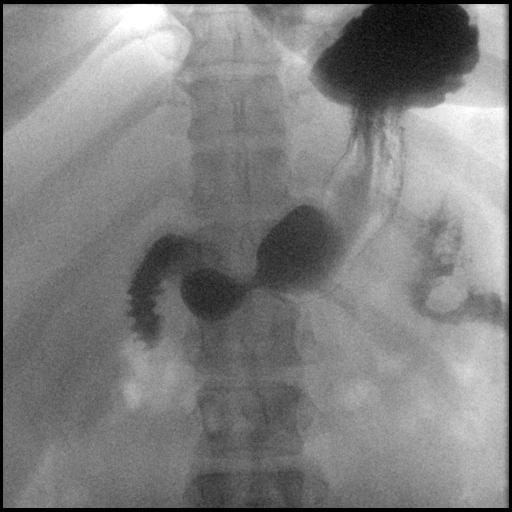

[Series 12: cp_standard · 0.34mm/px · 1 of 13 frames shown (7 of 7)]
[frame 12/13]
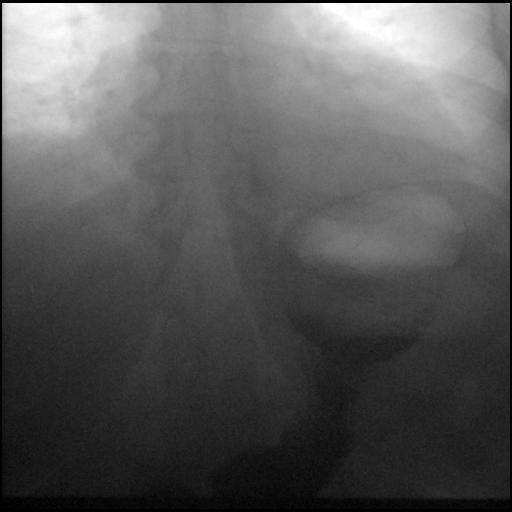

[12 of 24 positions shown; findings below may reference images not displayed]

FINDINGS: Initial KUB demonstrates normal bowel gas pattern. Mildly advanced
for age lower thoracic spondylosis.

The pharyngeal phase of swallowing appears normal. Primary
peristaltic waves in the esophagus. Normal on [DATE] swallows. A 13 mm
barium pill passed briskly into the stomach. No esophageal narrowing
superior

Mucosal relief assessment of the stomach appears normal. Normal
duodenal configuration and appearance
IMPRESSION: 1. Normal upper GI examination.

## 2021-05-22 ENCOUNTER — Encounter: Payer: Managed Care, Other (non HMO) | Attending: Surgery | Admitting: Skilled Nursing Facility1

## 2021-05-22 ENCOUNTER — Other Ambulatory Visit: Payer: Self-pay

## 2021-05-22 NOTE — Progress Notes (Signed)
Bariatric Nutrition Follow-Up Visit Medical Nutrition Therapy   NUTRITION ASSESSMENT    Surgery date: 02/26/2021 Surgery type: sleeve Start weight at [facility]: 293 Weight today: 242.5 pounds   Body Composition Scale 03/12/2021 05/22/2021  Current Body Weight 263.8 242.5  Total Body Fat % 45.6 42.4  Visceral Fat 14 13  Fat-Free Mass % 54.3 57.5   Total Body Water % 41.6 43.2  Muscle-Mass lbs 33.2 34.3  BMI 41.1 37.7  Body Fat Displacement           Torso  lbs 74.6 63.8         Left Leg  lbs 14.9 12.7         Right Leg  lbs 14.9 12.7         Left Arm  lbs 7.4 6.3         Right Arm   lbs 7.4 6.3   Lifestyle & Dietary Hx  Pt states she forgets to drink throughout the day.   Pt states she wants to try to jump rope for activity.  Pt states she typically adds fats to her veggies.   Estimated daily fluid intake: 40-50 oz Estimated daily protein intake: 90+ g Supplements: multi and calcium Current average weekly physical activity: walking 2 miles 2-3 days a week   24-Hr Dietary Recall First Meal 6am yogurt  Snack 7am:  eggs and sausage Second Meal: 2 protein shakes Snack:  nuts Third Meal: fish  Snack:  Beverages: water + flavorings  Post-Op Goals/ Signs/ Symptoms Using straws: no Drinking while eating: no Chewing/swallowing difficulties: no Changes in vision: no Changes to mood/headaches: no Hair loss/changes to skin/nails: no Difficulty focusing/concentrating: no Sweating: no Limb weakness: no Dizziness/lightheadedness: no Palpitations: no  Carbonated/caffeinated beverages: no N/V/D/C/Gas: no Abdominal pain: no Dumping syndrome: no    NUTRITION DIAGNOSIS  Overweight/obesity ([place]-3.3) related to past poor dietary habits and physical inactivity as evidenced by completed bariatric surgery and following dietary guidelines for continued weight loss and healthy nutrition status.     NUTRITION INTERVENTION Nutrition counseling (C-1) and education (E-2) to facilitate  bariatric surgery goals, including: Diet advancement to the next phase (phase 4) now including non starchy vegetables  The importance of consuming adequate calories as well as certain nutrients daily due to the body's need for essential vitamins, minerals, and fats The importance of daily physical activity and to reach a goal of at least 150 minutes of moderate to vigorous physical activity weekly (or as directed by their physician) due to benefits such as increased musculature and improved lab values The importance of intuitive eating specifically learning hunger-satiety cues and understanding the importance of learning a new body: The importance of mindful eating to avoid grazing behaviors   Goals: -Continue to aim for a minimum of 64 fluid ounces 7 days a week with at least 30 ounces being plain water  -Eat non-starchy vegetables 2 times a day 7 days a week  -Start out with soft cooked vegetables today and tomorrow; if tolerated begin to eat raw vegetables or cooked including salads  -Eat your 3 ounces of protein first then start in on your non-starchy vegetables; once you understand how much of your meal leads to satisfaction and not full while still eating 3 ounces of protein and non-starchy vegetables you can eat them in any order   -Continue to aim for 30 minutes of activity at least 5 times a week  -Do NOT cook with/add to your food: alfredo sauce, cheese sauce, barbeque sauce,  ketchup, fat back, butter, bacon grease, grease, Crisco, OR SUGAR  -limit yourself to one protein shake per day   Handouts Provided Include  Non starchy vegetables   Learning Style & Readiness for Change Teaching method utilized: Visual & Auditory  Demonstrated degree of understanding via: Teach Back  Readiness Level: action Barriers to learning/adherence to lifestyle change: none identified   RD's Notes for Next Visit Assess adherence to pt chosen goals    MONITORING & EVALUATION Dietary intake,  weekly physical activity, body weight  Next Steps Patient is to follow-up in January

## 2021-05-28 ENCOUNTER — Ambulatory Visit (INDEPENDENT_AMBULATORY_CARE_PROVIDER_SITE_OTHER): Payer: Managed Care, Other (non HMO) | Admitting: Obstetrics and Gynecology

## 2021-05-28 ENCOUNTER — Encounter: Payer: Self-pay | Admitting: Obstetrics and Gynecology

## 2021-05-28 ENCOUNTER — Other Ambulatory Visit: Payer: Self-pay

## 2021-05-28 VITALS — BP 110/68 | HR 100 | Ht 67.0 in | Wt 245.0 lb

## 2021-05-28 DIAGNOSIS — N814 Uterovaginal prolapse, unspecified: Secondary | ICD-10-CM | POA: Diagnosis not present

## 2021-05-28 DIAGNOSIS — N914 Secondary oligomenorrhea: Secondary | ICD-10-CM | POA: Diagnosis not present

## 2021-05-28 DIAGNOSIS — Z6838 Body mass index (BMI) 38.0-38.9, adult: Secondary | ICD-10-CM | POA: Diagnosis not present

## 2021-05-28 DIAGNOSIS — Z01419 Encounter for gynecological examination (general) (routine) without abnormal findings: Secondary | ICD-10-CM | POA: Diagnosis not present

## 2021-05-28 NOTE — Progress Notes (Signed)
45 y.o. G50P1001 Married Black or [place] American Not Hispanic or Latino female here for annual exam.   H/O oligomenorrhea. When she was seen at the end of June she was cycling every 1-3 months. Lab work was normal. She was given a script for yaz that she didn't start. She had a cycle on July 26, late August, then 05/10/21. She typically bleeds x 4 days, changes her up to every 2 hours.  No intermenstrual bleeding.   H/O asymptomatic uterine prolapse. Normal bowel and bladder function.   In 7/22 she underwent a gastric sleeve surgery. She has lost 30 lbs since her surgery.   No concerns. Sexually active infrequently, no dyspareunia.     Patient's last menstrual period was 05/10/2021.          Sexually active: No.  The current method of family planning is none.  Hasn't used contraception in years.   Exercising: Yes.     Walking  Smoker:  no  Health Maintenance: Pap:  06/02/2019 WNL Hr HPV Neg  History of abnormal Pap:  no MMG: 06/20/2019 clip placement 06/17/19 RT breast biopsy Fibroadenoma 12 o'clock.  BMD:   none  Colonoscopy: none  TDaP:  05/23/19 Gardasil: unsure    reports that she has never smoked. She has never used smokeless tobacco. She reports that she does not currently use alcohol. She reports that she does not use drugs. 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   Past Medical History:  Diagnosis Date   Anemia    Arthritis    Complication of anesthesia    hard to wake up 2018   Sleep apnea     Past Surgical History:  Procedure Laterality Date   FOOT SURGERY     LAPAROSCOPIC GASTRIC SLEEVE RESECTION N/A 02/26/2021   Procedure: LAPAROSCOPIC GASTRIC SLEEVE RESECTION;  Surgeon: Berna Bue, MD;  Location: [facility];  Service: General;  Laterality: N/A;   UPPER GI ENDOSCOPY N/A 02/26/2021   Procedure: UPPER GI ENDOSCOPY;  Surgeon: Berna Bue, MD;  Location: [facility];  Service: General;  Laterality: N/A;    Current Outpatient Medications   Medication Sig Dispense Refill   calcium carbonate (TUMS EX) 750 MG chewable tablet Chew by mouth.     Multiple Vitamin (MULTIVITAMIN ADULT PO) Take 1 tablet by mouth daily.     Multiple Vitamin (MULTIVITAMIN WITH MINERALS) TABS tablet Take 1 tablet by mouth daily.     pantoprazole (PROTONIX) 40 MG tablet Take 1 tablet (40 mg total) by mouth daily. (Patient not taking: Reported on 05/28/2021) 90 tablet 0   No current facility-administered medications for this visit.    Family History  Problem Relation Age of Onset   Diabetes Mother    Hyperlipidemia Mother    Hypertension Mother    Diabetes Father    Stroke Father    Healthy Sister    Diabetes Brother    Stroke Brother    Seizures Brother    Healthy Brother    Diabetes Maternal Grandmother     Review of Systems  All other systems reviewed and are negative.  Exam:   BP 110/68   Pulse 100   Ht 5\' 7"  (1.702 m)   Wt 245 lb (111.1 kg)   LMP 05/10/2021   SpO2 99%   BMI 38.37 kg/m   Weight change: @WEIGHTCHANGE @ Height:   Height: 5\' 7"  (170.2 cm)  Ht Readings from Last 3 Encounters:  05/28/21 5\' 7"  (1.702 m)  05/22/21 5\' 7"  (1.702 m)  03/13/21 5\' 7"  (1.702 m)    General appearance: alert, cooperative and appears stated age Head: Normocephalic, without obvious abnormality, atraumatic Neck: no adenopathy, supple, symmetrical, trachea midline and thyroid normal to inspection and palpation Lungs: clear to auscultation bilaterally Cardiovascular: regular rate and rhythm Breasts: normal appearance, no masses or tenderness Abdomen: soft, non-tender; non distended,  no masses,  no organomegaly Extremities: extremities normal, atraumatic, no cyanosis or edema Skin: Skin color, texture, turgor normal. No rashes or lesions Lymph nodes: Cervical, supraclavicular, and axillary nodes normal. No abnormal inguinal nodes palpated Neurologic: Grossly normal   Pelvic: External genitalia:  no lesions              Urethra:  normal  appearing urethra with no masses, tenderness or lesions              Bartholins and Skenes: normal                 Vagina: normal appearing vagina with normal color and discharge, no lesions              Cervix: no lesions and prolapses 1 cm out of the introitus, elongated                Bimanual Exam:  Uterus:   no masses or tenderness              Adnexa: no mass, fullness, tenderness               Rectovaginal: Confirms               Anus:  normal sphincter tone, no lesions  05/13/21 chaperoned for the exam.   1. Well woman exam Discussed breast self exam Discussed calcium and vit D intake No pap this year Overdue for mammogram, # given to schedule Labs with primary and gastric surgeon  2. Secondary oligomenorrhea Prior negative w/u, s/p gastric bypass in 7/22 her cycles may normalize with weight loss Call if she goes 2 months without a cycle Declines contraception  3. Uterine prolapse Not bothersome  4. BMI 38.0-38.9,adult Loosing weight s/p gastric bypass

## 2021-05-28 NOTE — Patient Instructions (Signed)

## 2021-08-13 ENCOUNTER — Encounter: Payer: Self-pay | Attending: Surgery | Admitting: Skilled Nursing Facility1

## 2021-08-23 DIAGNOSIS — K912 Postsurgical malabsorption, not elsewhere classified: Secondary | ICD-10-CM | POA: Diagnosis not present

## 2021-10-15 ENCOUNTER — Ambulatory Visit: Payer: Self-pay | Admitting: Skilled Nursing Facility1

## 2021-11-12 ENCOUNTER — Other Ambulatory Visit: Payer: Self-pay

## 2022-01-03 DIAGNOSIS — F4320 Adjustment disorder, unspecified: Secondary | ICD-10-CM | POA: Diagnosis not present

## 2022-01-28 ENCOUNTER — Ambulatory Visit (INDEPENDENT_AMBULATORY_CARE_PROVIDER_SITE_OTHER): Payer: BC Managed Care – PPO | Admitting: Physician Assistant

## 2022-01-28 ENCOUNTER — Encounter: Payer: Self-pay | Admitting: Physician Assistant

## 2022-01-28 VITALS — BP 102/66 | HR 67 | Temp 97.7°F | Ht 67.0 in | Wt 236.0 lb

## 2022-01-28 DIAGNOSIS — Z9884 Bariatric surgery status: Secondary | ICD-10-CM

## 2022-01-28 DIAGNOSIS — E7800 Pure hypercholesterolemia, unspecified: Secondary | ICD-10-CM | POA: Diagnosis not present

## 2022-01-28 DIAGNOSIS — R7303 Prediabetes: Secondary | ICD-10-CM

## 2022-01-28 DIAGNOSIS — Z0000 Encounter for general adult medical examination without abnormal findings: Secondary | ICD-10-CM

## 2022-01-28 DIAGNOSIS — E559 Vitamin D deficiency, unspecified: Secondary | ICD-10-CM | POA: Diagnosis not present

## 2022-01-28 DIAGNOSIS — Z1231 Encounter for screening mammogram for malignant neoplasm of breast: Secondary | ICD-10-CM | POA: Diagnosis not present

## 2022-01-28 NOTE — Progress Notes (Signed)
Complete physical exam   Patient: Kathleen Ramos   DOB: [birth year]   46 y.o. Female  MRN: [id number] Visit Date: 01/28/2022   Chief Complaint  Patient presents with   Annual Exam   Subjective    Kathleen Ramos is a 46 y.o. female who presents today for a complete physical exam.  She reports consuming a  post bariatric  diet. The patient does not participate in regular exercise at present. She generally feels fairly well. She does not have additional problems to discuss today.     Past Medical History:  Diagnosis Date   Anemia    Arthritis    Complication of anesthesia    hard to wake up 2018   Sleep apnea    Past Surgical History:  Procedure Laterality Date   FOOT SURGERY     LAPAROSCOPIC GASTRIC SLEEVE RESECTION N/A 02/26/2021   Procedure: LAPAROSCOPIC GASTRIC SLEEVE RESECTION;  Surgeon: Clovis Riley, MD;  Location: [facility];  Service: General;  Laterality: N/A;   UPPER GI ENDOSCOPY N/A 02/26/2021   Procedure: UPPER GI ENDOSCOPY;  Surgeon: Clovis Riley, MD;  Location: [facility];  Service: General;  Laterality: N/A;   Social History   Socioeconomic History   Marital status: Married    Spouse name: Not on file   Number of children: Not on file   Years of education: Not on file   Highest education level: Not on file  Occupational History   Not on file  Tobacco Use   Smoking status: Never   Smokeless tobacco: Never  Vaping Use   Vaping Use: Never used  Substance and Sexual Activity   Alcohol use: Not Currently    Comment: occassionally   Drug use: Never   Sexual activity: Yes    Birth control/protection: None  Other Topics Concern   Not on file  Social History Narrative   ** Merged History Encounter **       Social Determinants of Health   Financial Resource Strain: Not on file  Food Insecurity: Not on file  Transportation Needs: Not on file  Physical Activity: Not on file  Stress: Not on file  Social Connections: Not on file  Intimate Partner  Violence: Not on file     Medications: Outpatient Medications Prior to Visit  Medication Sig   calcium carbonate (TUMS EX) 750 MG chewable tablet Chew by mouth.   Multiple Vitamin (MULTIVITAMIN ADULT PO) Take 1 tablet by mouth daily.   Multiple Vitamin (MULTIVITAMIN WITH MINERALS) TABS tablet Take 1 tablet by mouth daily.   pantoprazole (PROTONIX) 40 MG tablet Take 1 tablet (40 mg total) by mouth daily.   No facility-administered medications prior to visit.    Review of Systems Review of Systems:  A fourteen system review of systems was performed and found to be positive as per HPI.   Last CBC Lab Results  Component Value Date   WBC 5.9 01/28/2022   HGB 12.0 01/28/2022   HCT 38.7 01/28/2022   MCV 83 01/28/2022   MCH 25.8 (L) 01/28/2022   RDW 14.0 01/28/2022   PLT 257 32/99/2426   Last metabolic panel Lab Results  Component Value Date   GLUCOSE 77 01/28/2022   NA 140 01/28/2022   K 4.2 01/28/2022   CL 105 01/28/2022   CO2 20 01/28/2022   BUN 11 01/28/2022   CREATININE 0.63 01/28/2022   GFRNONAA >60 02/27/2021   CALCIUM 9.0 01/28/2022   PROT 6.9 01/28/2022   ALBUMIN  4.0 01/28/2022   LABGLOB 2.9 01/28/2022   AGRATIO 1.4 01/28/2022   BILITOT 0.5 01/28/2022   ALKPHOS 64 01/28/2022   AST 13 01/28/2022   ALT 10 01/28/2022   ANIONGAP 10 02/27/2021   Last lipids Lab Results  Component Value Date   CHOL 200 (H) 01/28/2022   HDL 65 01/28/2022   LDLCALC 125 (H) 01/28/2022   TRIG 53 01/28/2022   CHOLHDL 3.1 01/28/2022   Last hemoglobin A1c Lab Results  Component Value Date   HGBA1C 5.2 01/28/2022   Last thyroid functions Lab Results  Component Value Date   TSH 1.230 01/28/2022   Last vitamin D Lab Results  Component Value Date   VD25OH 26.1 (L) 01/28/2022    Objective     BP 102/66   Pulse 67   Temp 97.7 F (36.5 C)   Ht 5' 7"  (1.702 m)   Wt 236 lb (107 kg)   SpO2 98%   BMI 36.96 kg/m  BP Readings from Last 3 Encounters:  01/28/22 102/66   05/28/21 110/68  02/28/21 139/64   Wt Readings from Last 3 Encounters:  01/28/22 236 lb (107 kg)  05/28/21 245 lb (111.1 kg)  05/22/21 242 lb 8 oz (110 kg)    Physical Exam   General Appearance:     Alert, cooperative, in no acute distress, appears stated age   Head:    Normocephalic, without obvious abnormality, atraumatic  Eyes:    PERRL, conjunctiva/corneas clear, EOM's intact, fundi    benign, both eyes  Ears:    Normal TM's and external ear canals, both ears  Nose:   Nares normal, septum midline, mucosa normal, no drainage    or sinus tenderness  Throat:   Lips, mucosa, and tongue normal; teeth and gums normal  Neck:   Supple, symmetrical, trachea midline, no adenopathy;    thyroid:  no enlargement/tenderness/nodules; no JVD  Back:     Symmetric, no curvature, ROM normal, no CVA tenderness  Lungs:     Clear to auscultation bilaterally, respirations unlabored  Chest Wall:    No tenderness or deformity   Heart:    Normal heart rate. Normal rhythm. No murmurs, rubs, or gallops.   Breast Exam:    deferred  Abdomen:     Soft, non-tender, bowel sounds active all four quadrants,    no masses, no organomegaly  Pelvic:    deferred  Extremities:   All extremities are intact. No cyanosis or edema  Pulses:   2+ and symmetric all extremities  Skin:   Skin color, texture, turgor normal, no rashes or lesions  Lymph nodes:   Cervical and supraclavicular nodes normal  Neurologic:   CNII-XII grossly intact.     Last depression screening scores    01/28/2022    1:16 PM 01/10/2021    3:53 PM 12/05/2020    5:15 PM  PHQ 2/9 Scores  PHQ - 2 Score 0 0 0  PHQ- 9 Score 0 5    Last fall risk screening    01/28/2022    1:16 PM  Fall Risk   Falls in the past year? 0  Number falls in past yr: 0  Injury with Fall? 0  Risk for fall due to : No Fall Risks  Follow up Falls evaluation completed     Results for orders placed or performed in visit on 01/28/22  CBC w/Diff  Result Value Ref  Range   WBC 5.9 3.4 - 10.8 x10E3/uL   RBC 4.66  3.77 - 5.28 x10E6/uL   Hemoglobin 12.0 11.1 - 15.9 g/dL   Hematocrit 38.7 34.0 - 46.6 %   MCV 83 79 - 97 fL   MCH 25.8 (L) 26.6 - 33.0 pg   MCHC 31.0 (L) 31.5 - 35.7 g/dL   RDW 14.0 11.7 - 15.4 %   Platelets 257 150 - 450 x10E3/uL   Neutrophils 56 Not Estab. %   Lymphs 37 Not Estab. %   Monocytes 6 Not Estab. %   Eos 1 Not Estab. %   Basos 0 Not Estab. %   Neutrophils Absolute 3.3 1.4 - 7.0 x10E3/uL   Lymphocytes Absolute 2.2 0.7 - 3.1 x10E3/uL   Monocytes Absolute 0.4 0.1 - 0.9 x10E3/uL   EOS (ABSOLUTE) 0.1 0.0 - 0.4 x10E3/uL   Basophils Absolute 0.0 0.0 - 0.2 x10E3/uL   Immature Granulocytes 0 Not Estab. %   Immature Grans (Abs) 0.0 0.0 - 0.1 x10E3/uL  Comp Met (CMET)  Result Value Ref Range   Glucose 77 70 - 99 mg/dL   BUN 11 6 - 24 mg/dL   Creatinine, Ser 0.63 0.57 - 1.00 mg/dL   eGFR 113 >59 mL/min/1.73   BUN/Creatinine Ratio 17 9 - 23   Sodium 140 134 - 144 mmol/L   Potassium 4.2 3.5 - 5.2 mmol/L   Chloride 105 96 - 106 mmol/L   CO2 20 20 - 29 mmol/L   Calcium 9.0 8.7 - 10.2 mg/dL   Total Protein 6.9 6.0 - 8.5 g/dL   Albumin 4.0 3.8 - 4.8 g/dL   Globulin, Total 2.9 1.5 - 4.5 g/dL   Albumin/Globulin Ratio 1.4 1.2 - 2.2   Bilirubin Total 0.5 0.0 - 1.2 mg/dL   Alkaline Phosphatase 64 44 - 121 IU/L   AST 13 0 - 40 IU/L   ALT 10 0 - 32 IU/L  Lipid Profile  Result Value Ref Range   Cholesterol, Total 200 (H) 100 - 199 mg/dL   Triglycerides 53 0 - 149 mg/dL   HDL 65 >39 mg/dL   VLDL Cholesterol Cal 10 5 - 40 mg/dL   LDL Chol Calc (NIH) 125 (H) 0 - 99 mg/dL   Chol/HDL Ratio 3.1 0.0 - 4.4 ratio  HgB A1c  Result Value Ref Range   Hgb A1c MFr Bld 5.2 4.8 - 5.6 %   Est. average glucose Bld gHb Est-mCnc 103 mg/dL  TSH  Result Value Ref Range   TSH 1.230 0.450 - 4.500 uIU/mL  Vitamin D (25 hydroxy)  Result Value Ref Range   Vit D, 25-Hydroxy 26.1 (L) 30.0 - 100.0 ng/mL    Assessment & Plan    Routine Health  Maintenance and Physical Exam  Exercise Activities and Dietary recommendations -Discussed heart healthy diet low in fat and carbohydrates. Recommend moderate exercise 150 mins/wk.  Immunization History  Administered Date(s) Administered   Influenza-Unspecified 05/02/2019   PFIZER(Purple Top)SARS-COV-2 Vaccination 10/13/2019, 11/03/2019   Tdap 05/23/2019    Health Maintenance  Topic Date Due   COVID-19 Vaccine (3 - Pfizer series) 12/29/2019   INFLUENZA VACCINE  03/04/2022   PAP SMEAR-Modifier  06/01/2022   TETANUS/TDAP  05/22/2029   Hepatitis C Screening  Completed   HIV Screening  Completed   HPV VACCINES  Aged Out    Discussed health benefits of physical activity, and encouraged her to engage in regular exercise appropriate for her age and condition.  Problem List Items Addressed This Visit       Other   Healthcare maintenance - Primary  Relevant Orders   CBC w/Diff (Completed)   Comp Met (CMET) (Completed)   Lipid Profile (Completed)   HgB A1c (Completed)   TSH (Completed)   Vitamin D deficiency   Relevant Orders   Vitamin D (25 hydroxy) (Completed)   Prediabetes   Relevant Orders   HgB A1c (Completed)   Other Visit Diagnoses     Encounter for screening mammogram for malignant neoplasm of breast       Relevant Orders   MM Digital Screening   Elevated LDL cholesterol level       Relevant Orders   Lipid Profile (Completed)      Patient is fasting so will obtain routine labs. Praised patient for weight loss. Recommend to follow nutritionist diet and exercise recommendations. Discussed adequate hydration.  Patient agreeable to screening mammogram, place order. UTD pap, Tdap, completed Hep C and HIV screenings. Advised to follow-up in 1 year for CPE and FBW or sooner if needed.  Return in about 1 year (around 01/29/2023) for CPE and FBW.       Lorrene Reid, PA-C  [facility] at [facility] [phone] (phone) [phone] (fax)  [place]

## 2022-01-29 LAB — CBC WITH DIFFERENTIAL/PLATELET
Basophils Absolute: 0 10*3/uL (ref 0.0–0.2)
Basos: 0 %
EOS (ABSOLUTE): 0.1 10*3/uL (ref 0.0–0.4)
Eos: 1 %
Hematocrit: 38.7 % (ref 34.0–46.6)
Hemoglobin: 12 g/dL (ref 11.1–15.9)
Immature Grans (Abs): 0 10*3/uL (ref 0.0–0.1)
Immature Granulocytes: 0 %
Lymphocytes Absolute: 2.2 10*3/uL (ref 0.7–3.1)
Lymphs: 37 %
MCH: 25.8 pg — ABNORMAL LOW (ref 26.6–33.0)
MCHC: 31 g/dL — ABNORMAL LOW (ref 31.5–35.7)
MCV: 83 fL (ref 79–97)
Monocytes Absolute: 0.4 10*3/uL (ref 0.1–0.9)
Monocytes: 6 %
Neutrophils Absolute: 3.3 10*3/uL (ref 1.4–7.0)
Neutrophils: 56 %
Platelets: 257 10*3/uL (ref 150–450)
RBC: 4.66 x10E6/uL (ref 3.77–5.28)
RDW: 14 % (ref 11.7–15.4)
WBC: 5.9 10*3/uL (ref 3.4–10.8)

## 2022-01-29 LAB — LIPID PANEL
Chol/HDL Ratio: 3.1 ratio (ref 0.0–4.4)
Cholesterol, Total: 200 mg/dL — ABNORMAL HIGH (ref 100–199)
HDL: 65 mg/dL (ref >39)
LDL Chol Calc (NIH): 125 mg/dL — ABNORMAL HIGH (ref 0–99)
Triglycerides: 53 mg/dL (ref 0–149)
VLDL Cholesterol Cal: 10 mg/dL (ref 5–40)

## 2022-01-29 LAB — COMPREHENSIVE METABOLIC PANEL
ALT: 10 IU/L (ref 0–32)
AST: 13 IU/L (ref 0–40)
Albumin/Globulin Ratio: 1.4 (ref 1.2–2.2)
Albumin: 4 g/dL (ref 3.8–4.8)
Alkaline Phosphatase: 64 IU/L (ref 44–121)
BUN/Creatinine Ratio: 17 (ref 9–23)
BUN: 11 mg/dL (ref 6–24)
Bilirubin Total: 0.5 mg/dL (ref 0.0–1.2)
CO2: 20 mmol/L (ref 20–29)
Calcium: 9 mg/dL (ref 8.7–10.2)
Chloride: 105 mmol/L (ref 96–106)
Creatinine, Ser: 0.63 mg/dL (ref 0.57–1.00)
Globulin, Total: 2.9 g/dL (ref 1.5–4.5)
Glucose: 77 mg/dL (ref 70–99)
Potassium: 4.2 mmol/L (ref 3.5–5.2)
Sodium: 140 mmol/L (ref 134–144)
Total Protein: 6.9 g/dL (ref 6.0–8.5)
eGFR: 113 mL/min/{1.73_m2} (ref >59)

## 2022-01-29 LAB — HEMOGLOBIN A1C
Est. average glucose Bld gHb Est-mCnc: 103 mg/dL
Hgb A1c MFr Bld: 5.2 % (ref 4.8–5.6)

## 2022-01-29 LAB — TSH: TSH: 1.23 u[IU]/mL (ref 0.450–4.500)

## 2022-01-29 LAB — VITAMIN D 25 HYDROXY (VIT D DEFICIENCY, FRACTURES): Vit D, 25-Hydroxy: 26.1 ng/mL — ABNORMAL LOW (ref 30.0–100.0)

## 2022-03-25 ENCOUNTER — Encounter: Payer: Self-pay | Admitting: Physician Assistant

## 2022-08-08 ENCOUNTER — Other Ambulatory Visit (HOSPITAL_COMMUNITY): Payer: Self-pay

## 2022-09-26 ENCOUNTER — Encounter (HOSPITAL_COMMUNITY): Payer: Self-pay | Admitting: *Deleted

## 2022-10-07 DIAGNOSIS — K912 Postsurgical malabsorption, not elsewhere classified: Secondary | ICD-10-CM | POA: Diagnosis not present

## 2022-10-07 DIAGNOSIS — E569 Vitamin deficiency, unspecified: Secondary | ICD-10-CM | POA: Diagnosis not present

## 2022-10-07 DIAGNOSIS — R5383 Other fatigue: Secondary | ICD-10-CM | POA: Diagnosis not present

## 2022-10-07 DIAGNOSIS — Z903 Acquired absence of stomach [part of]: Secondary | ICD-10-CM | POA: Diagnosis not present

## 2022-10-21 ENCOUNTER — Encounter: Payer: Self-pay | Admitting: Family Medicine

## 2022-10-21 ENCOUNTER — Ambulatory Visit (INDEPENDENT_AMBULATORY_CARE_PROVIDER_SITE_OTHER): Payer: BC Managed Care – PPO | Admitting: Family Medicine

## 2022-10-21 VITALS — BP 125/77 | HR 96 | Resp 18 | Ht 67.0 in | Wt 248.0 lb

## 2022-10-21 DIAGNOSIS — R5383 Other fatigue: Secondary | ICD-10-CM

## 2022-10-21 DIAGNOSIS — Z6838 Body mass index (BMI) 38.0-38.9, adult: Secondary | ICD-10-CM | POA: Diagnosis not present

## 2022-10-21 DIAGNOSIS — Z9884 Bariatric surgery status: Secondary | ICD-10-CM

## 2022-10-21 NOTE — Assessment & Plan Note (Signed)
Patient is interested in discussing weight loss medication.  We discussed that she will call her insurance before her follow-up appointment to see which ones they cover and what their requirements are.  Patient verbalized understanding and is agreeable to this plan.

## 2022-10-21 NOTE — Progress Notes (Signed)
Acute Office Visit  Subjective:     Patient ID: Kathleen Ramos, female    DOB: 28-Mar-1976, 47 y.o.   MRN: [id number]  Chief Complaint  Patient presents with   Fatigue    HPI Patient is in today for fatigue.  Patient is s/p gastric sleeve 2 years ago.  Followed up with [facility] 10/07/2022.  They recommended checking folate, vitamin B1, vitamin D, CBC, CMP, vitamin B12, iron panel but labs have not yet been done.  Has been experiencing fatigue for many months now.  She also works 13-hour days 5 days a week, from 6 AM to 7:30 PM, often takes a nap after work, and then does not go to sleep until 12 AM or 1 AM.  Even when she does sleep, she feels that it is not restful.  She has a history of obstructive sleep apnea verified by a sleep study, but she does not want to start a CPAP machine.  She does feel that she has been stressed and mood may not be super stable, but she does have a good support system with her husband and her mother.  She is also in therapy right now. Additionally, she is interested in discussing weight management options.  Review of Systems  Constitutional:  Positive for malaise/fatigue. Negative for chills and fever.  Respiratory:  Negative for cough and shortness of breath.   Cardiovascular:  Negative for chest pain and palpitations.  Gastrointestinal:  Negative for nausea and vomiting.  Psychiatric/Behavioral:  Negative for depression. The patient has insomnia. The patient is not nervous/anxious.      Objective:    BP 125/77 (BP Location: Left Arm, Patient Position: Sitting, Cuff Size: Large)   Pulse 96   Resp 18   Ht 5\' 7"  (1.702 m)   Wt 248 lb (112.5 kg)   SpO2 95%   BMI 38.84 kg/m   Physical Exam Constitutional:      General: She is not in acute distress.    Appearance: Normal appearance.  HENT:     Head: Normocephalic and atraumatic.  Cardiovascular:     Rate and Rhythm: Normal rate and regular rhythm.     Heart sounds: Normal heart sounds.  No murmur heard.    No friction rub. No gallop.  Pulmonary:     Effort: Pulmonary effort is normal. No respiratory distress.     Breath sounds: No wheezing, rhonchi or rales.  Skin:    General: Skin is warm and dry.  Neurological:     Mental Status: She is alert and oriented to person, place, and time.      Assessment & Plan:  Other fatigue Assessment & Plan: Will complete labs that were recommended by general surgery in addition to TSH to evaluate for possible causes of fatigue.  We also discussed that an adequate sleep can play a factor, including poor sleep hygiene or not getting restful sleep.  Recommended trial of sleep apnea mouthguard to see if it is helpful since she is not interested in considering a CPAP machine.  We also discussed that mood can affect sleep and cause fatigue.  Patient is going to see her therapist on Friday, but will let me know if she does want to discuss starting medication for her mood.  Orders: -     B12 and Folate Panel -     Vitamin B1 -     VITAMIN D 25 Hydroxy (Vit-D Deficiency, Fractures); Future -     CBC with  Differential/Platelet; Future -     Comprehensive metabolic panel; Future -     TSH; Future -     Iron, TIBC and Ferritin Panel; Future  S/P bariatric surgery -     B12 and Folate Panel -     Vitamin B1 -     VITAMIN D 25 Hydroxy (Vit-D Deficiency, Fractures); Future -     CBC with Differential/Platelet; Future -     Comprehensive metabolic panel; Future -     TSH; Future -     Iron, TIBC and Ferritin Panel; Future  BMI 38.0-38.9,adult Assessment & Plan: Patient is interested in discussing weight loss medication.  We discussed that she will call her insurance before her follow-up appointment to see which ones they cover and what their requirements are.  Patient verbalized understanding and is agreeable to this plan.     Return in about 6 weeks (around 12/02/2022) for follow-up for fatigue, sleep, weight management.  Velva Harman, PA

## 2022-10-21 NOTE — Assessment & Plan Note (Addendum)
Will complete labs that were recommended by general surgery in addition to TSH to evaluate for possible causes of fatigue.  We also discussed that an adequate sleep can play a factor, including poor sleep hygiene or not getting restful sleep.  Recommended trial of sleep apnea mouthguard to see if it is helpful since she is not interested in considering a CPAP machine.  We also discussed that mood can affect sleep and cause fatigue.  Patient is going to see her therapist on Friday, but will let me know if she does want to discuss starting medication for her mood.

## 2022-10-21 NOTE — Patient Instructions (Addendum)
Call your insurance to see which weight loss medications they cover and what their requirements are!  Link to [employer] Sleep Apnea Mouth Guard: https://www.amazon.com/ZQuiet%C2%AE-Anti-Snoring-Cleared-Comfort-Mouthpiece/dp/B07FDKTMBD?source=ps-sl-shoppingads-lpcontext&ref_=fplfs&psc=1&smid=[email or web address]   Bedtime routine checklist: 1. Avoid naps during the day 2. Avoid stimulants such as caffeine and nicotine. Avoid bedtime alcohol (it can speed onset of sleep but the body's metabolism can cause awakenings). 3. All forms of exercise help ensure sound sleep - limit vigorous exercise to morning or late afternoon 4. Avoid food too close to bedtime including chocolate (which contains caffeine) 5. Soak up natural light 6. Establish regular bedtime routine. 7. Associate bed with sleep - avoid TV, computer or phone, reading while in bed. 8. Ensure pleasant, relaxing sleep environment - quiet, dark, cool room.

## 2022-10-22 ENCOUNTER — Encounter: Payer: Self-pay | Admitting: Family Medicine

## 2022-10-22 DIAGNOSIS — R7303 Prediabetes: Secondary | ICD-10-CM

## 2022-10-22 DIAGNOSIS — E282 Polycystic ovarian syndrome: Secondary | ICD-10-CM

## 2022-10-22 LAB — COMPREHENSIVE METABOLIC PANEL
ALT: 18 IU/L (ref 0–32)
AST: 22 IU/L (ref 0–40)
Albumin/Globulin Ratio: 1.3 (ref 1.2–2.2)
Albumin: 4 g/dL (ref 3.9–4.9)
Alkaline Phosphatase: 79 IU/L (ref 44–121)
BUN/Creatinine Ratio: 20 (ref 9–23)
BUN: 13 mg/dL (ref 6–24)
Bilirubin Total: <0.2 mg/dL (ref 0.0–1.2)
CO2: 18 mmol/L — ABNORMAL LOW (ref 20–29)
Calcium: 8.7 mg/dL (ref 8.7–10.2)
Chloride: 107 mmol/L — ABNORMAL HIGH (ref 96–106)
Creatinine, Ser: 0.66 mg/dL (ref 0.57–1.00)
Globulin, Total: 3.1 g/dL (ref 1.5–4.5)
Glucose: 172 mg/dL — ABNORMAL HIGH (ref 70–99)
Potassium: 4.3 mmol/L (ref 3.5–5.2)
Sodium: 139 mmol/L (ref 134–144)
Total Protein: 7.1 g/dL (ref 6.0–8.5)
eGFR: 111 mL/min/{1.73_m2} (ref >59)

## 2022-10-22 LAB — CBC WITH DIFFERENTIAL/PLATELET
Basophils Absolute: 0 10*3/uL (ref 0.0–0.2)
Basos: 0 %
EOS (ABSOLUTE): 0.1 10*3/uL (ref 0.0–0.4)
Eos: 1 %
Hematocrit: 38.9 % (ref 34.0–46.6)
Hemoglobin: 12.5 g/dL (ref 11.1–15.9)
Immature Grans (Abs): 0 10*3/uL (ref 0.0–0.1)
Immature Granulocytes: 0 %
Lymphocytes Absolute: 2.3 10*3/uL (ref 0.7–3.1)
Lymphs: 34 %
MCH: 26.5 pg — ABNORMAL LOW (ref 26.6–33.0)
MCHC: 32.1 g/dL (ref 31.5–35.7)
MCV: 82 fL (ref 79–97)
Monocytes Absolute: 0.4 10*3/uL (ref 0.1–0.9)
Monocytes: 7 %
Neutrophils Absolute: 3.9 10*3/uL (ref 1.4–7.0)
Neutrophils: 58 %
Platelets: 249 10*3/uL (ref 150–450)
RBC: 4.72 x10E6/uL (ref 3.77–5.28)
RDW: 14.4 % (ref 11.7–15.4)
WBC: 6.8 10*3/uL (ref 3.4–10.8)

## 2022-10-22 LAB — IRON,TIBC AND FERRITIN PANEL
Ferritin: 30 ng/mL (ref 15–150)
Iron Saturation: 17 % (ref 15–55)
Iron: 76 ug/dL (ref 27–159)
Total Iron Binding Capacity: 435 ug/dL (ref 250–450)
UIBC: 359 ug/dL (ref 131–425)

## 2022-10-22 LAB — TSH: TSH: 0.893 u[IU]/mL (ref 0.450–4.500)

## 2022-10-22 LAB — VITAMIN D 25 HYDROXY (VIT D DEFICIENCY, FRACTURES): Vit D, 25-Hydroxy: 36.2 ng/mL (ref 30.0–100.0)

## 2022-10-23 DIAGNOSIS — E282 Polycystic ovarian syndrome: Secondary | ICD-10-CM | POA: Insufficient documentation

## 2022-10-23 LAB — B12 AND FOLATE PANEL
Folate: 8.7 ng/mL (ref >3.0)
Vitamin B-12: 541 pg/mL (ref 232–1245)

## 2022-10-23 LAB — VITAMIN B1

## 2022-10-23 NOTE — Addendum Note (Signed)
Addended by: Virgil Benedict on: 10/23/2022 01:16 PM   Modules accepted: Orders

## 2022-10-27 DIAGNOSIS — F4320 Adjustment disorder, unspecified: Secondary | ICD-10-CM | POA: Diagnosis not present

## 2022-12-08 ENCOUNTER — Telehealth: Payer: Self-pay

## 2022-12-08 ENCOUNTER — Other Ambulatory Visit (INDEPENDENT_AMBULATORY_CARE_PROVIDER_SITE_OTHER): Payer: BC Managed Care – PPO

## 2022-12-08 DIAGNOSIS — E282 Polycystic ovarian syndrome: Secondary | ICD-10-CM

## 2022-12-08 LAB — COMPREHENSIVE METABOLIC PANEL
ALT: 15 U/L (ref 0–35)
AST: 19 U/L (ref 0–37)
Albumin: 3.8 g/dL (ref 3.5–5.2)
Alkaline Phosphatase: 50 U/L (ref 39–117)
BUN: 18 mg/dL (ref 6–23)
CO2: 25 mEq/L (ref 19–32)
Calcium: 9.2 mg/dL (ref 8.4–10.5)
Chloride: 105 mEq/L (ref 96–112)
Creatinine, Ser: 0.79 mg/dL (ref 0.40–1.20)
GFR: 90.72 mL/min (ref >60.00)
Glucose, Bld: 82 mg/dL (ref 70–99)
Potassium: 4.4 mEq/L (ref 3.5–5.1)
Sodium: 138 mEq/L (ref 135–145)
Total Bilirubin: 0.3 mg/dL (ref 0.2–1.2)
Total Protein: 7.2 g/dL (ref 6.0–8.3)

## 2022-12-08 LAB — T4, FREE: Free T4: 0.85 ng/dL (ref 0.60–1.60)

## 2022-12-08 LAB — TSH: TSH: 0.83 u[IU]/mL (ref 0.35–5.50)

## 2022-12-08 NOTE — Telephone Encounter (Signed)
Orders Placed This Encounter  Procedures   Hemoglobin A1c    Standing Status:   Future    Standing Expiration Date:   12/08/2023   Comp Met (CMET)    Standing Status:   Future    Standing Expiration Date:   12/08/2023   Lipid panel    Standing Status:   Future    Standing Expiration Date:   12/08/2023   TSH    Standing Status:   Future    Standing Expiration Date:   12/08/2023   T4, free    Standing Status:   Future    Standing Expiration Date:   12/08/2023   Insulin antibodies, blood    Standing Status:   Future    Standing Expiration Date:   06/10/2023

## 2022-12-08 NOTE — Telephone Encounter (Signed)
Orders Placed This Encounter  Procedures   Hemoglobin A1c    Standing Status:   Future    Standing Expiration Date:   12/08/2023   Comp Met (CMET)    Standing Status:   Future    Standing Expiration Date:   12/08/2023   Lipid panel    Standing Status:   Future    Standing Expiration Date:   12/08/2023   Glutamic acid decarboxylase auto abs    Standing Status:   Future    Standing Expiration Date:   06/10/2023   TSH    Standing Status:   Future    Standing Expiration Date:   12/08/2023   T4, free    Standing Status:   Future    Standing Expiration Date:   12/08/2023

## 2022-12-08 NOTE — Addendum Note (Signed)
Addended by: Pollie Meyer on: 12/08/2022 11:47 AM   Modules accepted: Orders

## 2022-12-09 ENCOUNTER — Encounter: Payer: Self-pay | Admitting: "Endocrinology

## 2022-12-09 ENCOUNTER — Ambulatory Visit (INDEPENDENT_AMBULATORY_CARE_PROVIDER_SITE_OTHER): Payer: BC Managed Care – PPO | Admitting: "Endocrinology

## 2022-12-09 ENCOUNTER — Telehealth: Payer: Self-pay

## 2022-12-09 DIAGNOSIS — Z9884 Bariatric surgery status: Secondary | ICD-10-CM

## 2022-12-09 DIAGNOSIS — E7800 Pure hypercholesterolemia, unspecified: Secondary | ICD-10-CM | POA: Diagnosis not present

## 2022-12-09 DIAGNOSIS — E282 Polycystic ovarian syndrome: Secondary | ICD-10-CM

## 2022-12-09 DIAGNOSIS — Z6839 Body mass index (BMI) 39.0-39.9, adult: Secondary | ICD-10-CM

## 2022-12-09 LAB — LIPID PANEL
HDL: 67 mg/dL (ref >=50)
Non-HDL Cholesterol (Calc): 144 mg/dL (calc) — ABNORMAL HIGH (ref <130)

## 2022-12-09 LAB — HEMOGLOBIN A1C
Hgb A1c MFr Bld: 5.5 % of total Hgb (ref <5.7)
eAG (mmol/L): 6.2 mmol/L

## 2022-12-09 MED ORDER — TIRZEPATIDE 2.5 MG/0.5ML ~~LOC~~ SOAJ
2.5000 mg | SUBCUTANEOUS | 0 refills | Status: DC
Start: 2022-12-09 — End: 2023-05-08

## 2022-12-09 NOTE — Telephone Encounter (Signed)
Orders Placed This Encounter  Procedures   Insulin, random    Standing Status:   Future    Standing Expiration Date:   06/11/2023

## 2022-12-09 NOTE — Progress Notes (Signed)
Outpatient Endocrinology Note Kathleen Gladwin, MD    Kathleen Ramos 08/19/1975 [id number]  Referring Provider: Melida Quitter, PA Primary Care Provider: Melida Quitter, PA Reason for consultation: PCOS   Assessment & Plan  Diagnoses and all orders for this visit:  Class 2 severe obesity due to excess calories with serious comorbidity and body mass index (BMI) of 39.0 to 39.9 in adult [facility]) -     Cancel: Insulin, random -     Insulin, random; Future  Pure hypercholesterolemia  History of weight loss surgery  PCOS (polycystic ovarian syndrome) -     Cancel: Insulin, random -     Insulin, random; Future  Other orders -     tirzepatide [facility]) 2.5 MG/0.5ML Pen; Inject 2.5 mg into the skin once a week.   Grade 2 obesity, complicated by hypercholesterolemia, A1C and LFTs WNL Patient has history of gastric sleeve in 2022, lost 40 pounds and gained back 5 pounds No history of MEN syndrome/medullary thyroid cancer/pancreatitis or pancreatic cancer in self or family Patient is interested in [place], ordered Mounjaro 2.5 mg weekly  PCOS diagnosis clinically based off of irregular menstrual cycle and excessive facial and neck hair Ferryman Gallwey score is 6 Patient is open to metformin after she has figured out if [employer] is covered  Discussed lifestyle changes, medical management as well as bariatric surgery Maintain healthy lifestyle including 1200 Cal/day, 30 min of activity/day, avoiding refined/processed/outside food 20 minutes physical activity per day, in continuum or interruptedly through the day  Goals: less than 60 grams of carbohydrate/meal, 1200-1500 Cal/day, 10,0000 steps a day and weight loss of 0.5-1 lb/ wk    Return in about 4 weeks (around 01/06/2023) for labs 8 am berore visit .   Kathleen [place], MD  12/09/22   History of Present Illness HPI  Kathleen Ramos is a 47 y.o. year old female who presents for evaluation of PCOS, prediabetes and  obesity.  Kathleen Ramos was first diagnosed of PCOS in 2022.    Had menarche around age 47 Were regular period until 47  Has irregular period since Not on any birth control - pt chose not to be on it  Has excessive hair on chin and neck Waxes hair, feels they are taken care of   S/p Laparoscopic gastric sleeve resection in 02/26/2021. Lost 40 lbs with it and gained 5 lbs back.  Physical Exam  BP 112/64 (BP Location: Left Arm, Patient Position: Sitting, Cuff Size: Normal)   Pulse 87   Ht 5\' 7"  (1.702 m)   Wt 250 lb 9.6 oz (113.7 kg)   SpO2 99%   BMI 39.25 kg/m    Constitutional: well developed, well nourished Head: normocephalic, atraumatic Eyes: sclera anicteric, no redness Neck: supple Lungs: normal respiratory effort Neurology: alert and oriented Skin: dry, no appreciable rashes Musculoskeletal: no appreciable defects Psychiatric: normal mood and affect   Current Medications Patient's Medications  New Prescriptions   TIRZEPATIDE (MOUNJARO) 2.5 MG/0.5ML PEN    Inject 2.5 mg into the skin once a week.  Previous Medications   CALCIUM CARBONATE (TUMS EX) 750 MG CHEWABLE TABLET    Chew by mouth.   MULTIPLE VITAMIN (MULTIVITAMIN ADULT PO)    Take 1 tablet by mouth daily.   MULTIPLE VITAMIN (MULTIVITAMIN WITH MINERALS) TABS TABLET    Take 1 tablet by mouth daily.   PANTOPRAZOLE (PROTONIX) 40 MG TABLET    Take 1 tablet (40 mg total) by mouth daily.  Modified Medications   No medications on file  Discontinued Medications   No medications on file    Allergies No Known Allergies  Past Medical History Past Medical History:  Diagnosis Date   Anemia    Arthritis    Complication of anesthesia    hard to wake up 2018   Sleep apnea     Past Surgical History Past Surgical History:  Procedure Laterality Date   FOOT SURGERY     LAPAROSCOPIC GASTRIC SLEEVE RESECTION N/A 02/26/2021   Procedure: LAPAROSCOPIC GASTRIC SLEEVE RESECTION;  Surgeon: Berna Bue, MD;   Location: [facility];  Service: General;  Laterality: N/A;   UPPER GI ENDOSCOPY N/A 02/26/2021   Procedure: UPPER GI ENDOSCOPY;  Surgeon: Berna Bue, MD;  Location: [facility];  Service: General;  Laterality: N/A;    Family History family history includes Diabetes in her brother, father, maternal grandmother, and mother; Healthy in her brother and sister; Hyperlipidemia in her mother; Hypertension in her mother; Seizures in her brother; Stroke in her brother and father.  Social History Social History   Socioeconomic History   Marital status: Married    Spouse name: Not on file   Number of children: Not on file   Years of education: Not on file   Highest education level: Not on file  Occupational History   Not on file  Tobacco Use   Smoking status: Never    Passive exposure: Never   Smokeless tobacco: Never  Vaping Use   Vaping Use: Never used  Substance and Sexual Activity   Alcohol use: Not Currently    Comment: occassionally   Drug use: Never   Sexual activity: Yes    Birth control/protection: None  Other Topics Concern   Not on file  Social History Narrative   ** Merged History Encounter **       Social Determinants of Health   Financial Resource Strain: Not on file  Food Insecurity: Not on file  Transportation Needs: Not on file  Physical Activity: Not on file  Stress: Not on file  Social Connections: Not on file  Intimate Partner Violence: Not on file    Lab Results  Component Value Date   CHOL 211 (H) 12/08/2022   Lab Results  Component Value Date   HDL 67 12/08/2022   Lab Results  Component Value Date   LDLCALC 123 (H) 12/08/2022   Lab Results  Component Value Date   TRIG 104 12/08/2022   Lab Results  Component Value Date   CHOLHDL 3.1 12/08/2022   Lab Results  Component Value Date   CREATININE 0.79 12/08/2022   Lab Results  Component Value Date   GFR 90.72 12/08/2022      Component Value Date/Time   NA 138 12/08/2022 1355   NA 139  10/21/2022 1535   K 4.4 12/08/2022 1355   CL 105 12/08/2022 1355   CO2 25 12/08/2022 1355   GLUCOSE 82 12/08/2022 1355   BUN 18 12/08/2022 1355   BUN 13 10/21/2022 1535   CREATININE 0.79 12/08/2022 1355   CALCIUM 9.2 12/08/2022 1355   PROT 7.2 12/08/2022 1355   PROT 7.1 10/21/2022 1535   ALBUMIN 3.8 12/08/2022 1355   ALBUMIN 4.0 10/21/2022 1535   AST 19 12/08/2022 1355   ALT 15 12/08/2022 1355   ALKPHOS 50 12/08/2022 1355   BILITOT 0.3 12/08/2022 1355   BILITOT <0.2 10/21/2022 1535   GFRNONAA >60 02/27/2021 0421   GFRAA 132 12/05/2019 1454  Latest Ref Rng & Units 12/08/2022    1:55 PM 10/21/2022    3:35 PM 01/28/2022    1:48 PM  BMP  Glucose 70 - 99 mg/dL 82  161  77   BUN 6 - 23 mg/dL 18  13  11    Creatinine 0.40 - 1.20 mg/dL 0.96  0.45  4.09   BUN/Creat Ratio 9 - 23  20  17    Sodium 135 - 145 mEq/L 138  139  140   Potassium 3.5 - 5.1 mEq/L 4.4  4.3  4.2   Chloride 96 - 112 mEq/L 105  107  105   CO2 19 - 32 mEq/L 25  18  20    Calcium 8.4 - 10.5 mg/dL 9.2  8.7  9.0        Component Value Date/Time   WBC 6.8 10/21/2022 1535   WBC 9.6 02/27/2021 0421   RBC 4.72 10/21/2022 1535   RBC 4.37 02/27/2021 0421   HGB 12.5 10/21/2022 1535   HCT 38.9 10/21/2022 1535   PLT 249 10/21/2022 1535   MCV 82 10/21/2022 1535   MCH 26.5 (L) 10/21/2022 1535   MCH 26.3 02/27/2021 0421   MCHC 32.1 10/21/2022 1535   MCHC 30.6 02/27/2021 0421   RDW 14.4 10/21/2022 1535   LYMPHSABS 2.3 10/21/2022 1535   MONOABS 0.3 02/27/2021 0421   EOSABS 0.1 10/21/2022 1535   BASOSABS 0.0 10/21/2022 1535     Parts of this note may have been dictated using voice recognition software. There may be variances in spelling and vocabulary which are unintentional. Not all errors are proofread. Please notify the Thereasa Parkin if any discrepancies are noted or if the meaning of any statement is not clear.

## 2022-12-10 ENCOUNTER — Encounter: Payer: Self-pay | Admitting: "Endocrinology

## 2022-12-10 NOTE — Telephone Encounter (Signed)
Can someone help with a PA on Mounjaro?

## 2022-12-11 ENCOUNTER — Ambulatory Visit (INDEPENDENT_AMBULATORY_CARE_PROVIDER_SITE_OTHER): Payer: BC Managed Care – PPO | Admitting: Family Medicine

## 2022-12-11 ENCOUNTER — Telehealth: Payer: Self-pay

## 2022-12-11 ENCOUNTER — Encounter: Payer: Self-pay | Admitting: Family Medicine

## 2022-12-11 VITALS — BP 122/80 | HR 72 | Resp 18 | Ht 67.0 in | Wt 252.0 lb

## 2022-12-11 DIAGNOSIS — R5383 Other fatigue: Secondary | ICD-10-CM | POA: Diagnosis not present

## 2022-12-11 DIAGNOSIS — G4733 Obstructive sleep apnea (adult) (pediatric): Secondary | ICD-10-CM

## 2022-12-11 NOTE — Assessment & Plan Note (Signed)
Improving.  Recommend to continue with good sleep hygiene.  Recommend trial of sleep apnea mouthguard to treat OSA as this may be contributing to nonrestorative sleep.

## 2022-12-11 NOTE — Progress Notes (Signed)
   Established Patient Office Visit  Subjective   Patient ID: Kathleen Ramos, female    DOB: 1976/06/14  Age: 47 y.o. MRN: [id number]  Chief Complaint  Patient presents with   Fatigue   Insomnia        Weight Management Screening    Insomnia   Kathleen Ramos is a 47 y.o. female presenting today for follow up of fatigue, insomnia, weight management. Lab work from last appointment including B12 and folate, vitamin D, thyroid, iron studies within normal limits.  Fatigue most likely secondary to poor sleep quality with untreated OSA.  Patient still does not want to use a CPAP machine.  She does have the over-the-counter mouthpiece in her [employer] cart but has not tried it yet.  She has changed her sleep habits and stopped taking a nap before bed and has found that this has been helpful for her fatigue. She is now seeing endocrinology, she was diagnosed with PCOS and they are starting [facility] for treatment.  ROS Negative unless otherwise noted in HPI   Objective:     BP 122/80 (BP Location: Left Arm, Patient Position: Sitting, Cuff Size: Large)   Pulse 72   Resp 18   Ht 5\' 7"  (1.702 m)   Wt 252 lb (114.3 kg)   SpO2 99%   BMI 39.47 kg/m   Physical Exam Constitutional:      General: She is not in acute distress.    Appearance: Normal appearance.  HENT:     Head: Normocephalic and atraumatic.  Cardiovascular:     Rate and Rhythm: Normal rate and regular rhythm.     Pulses: Normal pulses.     Heart sounds: No murmur heard.    No friction rub. No gallop.  Pulmonary:     Effort: Pulmonary effort is normal. No respiratory distress.     Breath sounds: No wheezing, rhonchi or rales.  Skin:    General: Skin is warm and dry.  Neurological:     Mental Status: She is alert and oriented to person, place, and time.     Assessment & Plan:  Obstructive sleep apnea Assessment & Plan: Recommend trial of over-the-counter mouthpiece as alternative to CPAP.  Highly recommend starting  this as soon as possible, if effective then can ask dentist for prescription mouthpiece at upcoming appointment.   Other fatigue Assessment & Plan: Improving.  Recommend to continue with good sleep hygiene.  Recommend trial of sleep apnea mouthguard to treat OSA as this may be contributing to nonrestorative sleep.   Recent lab work within the last 1-2 months by primary care and endocrinology within normal limits.  Due for annual physical at the end of June, can use results from lab work that has already been completed.  After that, continue follow-up with endocrinology.  Primary care will only need to see her for an annual physical unless there are any changes.  Return in about 7 weeks (around 01/30/2023) for annual physical, no need for labwork since most recent was within normal limits.    Melida Quitter, PA

## 2022-12-11 NOTE — Assessment & Plan Note (Signed)
Recommend trial of over-the-counter mouthpiece as alternative to CPAP.  Highly recommend starting this as soon as possible, if effective then can ask dentist for prescription mouthpiece at upcoming appointment.

## 2022-12-11 NOTE — Patient Instructions (Addendum)
I would definitely recommend trying the mouthpiece to see how it helps you sleep so that if it is helpful you can ask your dentist for a prescription when you go in a few weeks!  I will see you at the end of June for your annual physical, and from then I will only need to see you once every year since you will be seeing endocrinology and they will keep up with your labs.

## 2022-12-11 NOTE — Telephone Encounter (Signed)
PA initiated for [facility]  [facility] [employer] [facility]# [id number] PCN # [id number]# [id number]  Member ID# [id number]

## 2022-12-18 LAB — LIPID PANEL
Cholesterol: 211 mg/dL — ABNORMAL HIGH (ref <200)
LDL Cholesterol (Calc): 123 mg/dL (calc) — ABNORMAL HIGH
Total CHOL/HDL Ratio: 3.1 (calc) (ref <5.0)
Triglycerides: 104 mg/dL (ref <150)

## 2022-12-18 LAB — HEMOGLOBIN A1C: Mean Plasma Glucose: 111 mg/dL

## 2022-12-18 LAB — INSULIN ANTIBODIES, BLOOD: Insulin Antibodies, Human: <0.4 U/mL (ref <0.4)

## 2022-12-18 NOTE — Telephone Encounter (Signed)
PA initiated in 5/9 encounter

## 2022-12-26 ENCOUNTER — Encounter: Payer: Self-pay | Admitting: Family Medicine

## 2023-01-12 DIAGNOSIS — F4320 Adjustment disorder, unspecified: Secondary | ICD-10-CM | POA: Diagnosis not present

## 2023-01-22 ENCOUNTER — Inpatient Hospital Stay: Admission: RE | Admit: 2023-01-22 | Payer: BC Managed Care – PPO | Source: Ambulatory Visit

## 2023-01-22 ENCOUNTER — Other Ambulatory Visit: Payer: Self-pay | Admitting: Family Medicine

## 2023-01-22 DIAGNOSIS — R7303 Prediabetes: Secondary | ICD-10-CM

## 2023-01-22 DIAGNOSIS — E559 Vitamin D deficiency, unspecified: Secondary | ICD-10-CM

## 2023-01-22 DIAGNOSIS — Z1231 Encounter for screening mammogram for malignant neoplasm of breast: Secondary | ICD-10-CM

## 2023-01-22 DIAGNOSIS — E7800 Pure hypercholesterolemia, unspecified: Secondary | ICD-10-CM

## 2023-01-22 DIAGNOSIS — Z0000 Encounter for general adult medical examination without abnormal findings: Secondary | ICD-10-CM

## 2023-01-22 DIAGNOSIS — Z9884 Bariatric surgery status: Secondary | ICD-10-CM

## 2023-01-29 ENCOUNTER — Encounter: Payer: BC Managed Care – PPO | Admitting: Family Medicine

## 2023-01-29 NOTE — Progress Notes (Deleted)
Complete physical exam  Patient: Kathleen Ramos   DOB: 10-22-1975   47 y.o. Female  MRN: [id number]  Subjective:    No chief complaint on file.   Kathleen Ramos is a 47 y.o. female who presents today for a complete physical exam. She reports consuming a {diet types:17450} diet. {types:19826} She generally feels {DESC; WELL/FAIRLY WELL/POORLY:18703}. She reports sleeping {DESC; WELL/FAIRLY WELL/POORLY:18703}. She {does/does not:200015} have additional problems to discuss today.    Most recent fall risk assessment:    10/21/2022    3:07 PM  Fall Risk   Falls in the past year? 0  Number falls in past yr: 0  Injury with Fall? 0  Risk for fall due to : No Fall Risks     Most recent depression and anxiety screenings:    10/21/2022    3:07 PM 01/28/2022    1:16 PM  PHQ 2/9 Scores  PHQ - 2 Score 0 0  PHQ- 9 Score  0      01/28/2022    1:17 PM 01/10/2021    3:53 PM  GAD 7 : Generalized Anxiety Score  Nervous, Anxious, on Edge 0 0  Control/stop worrying 0 1  Worry too much - different things 0 1  Trouble relaxing 0 0  Restless 0 0  Easily annoyed or irritable 0 0  Afraid - awful might happen 0 0  Total GAD 7 Score 0 2  Anxiety Difficulty Not difficult at all Not difficult at all    Patient Active Problem List   Diagnosis Date Noted   PCOS (polycystic ovarian syndrome) 10/23/2022   Anemia 03/21/2021   History of depression 03/21/2021   Hyperlipidemia 03/21/2021   Obstructive sleep apnea 03/21/2021   Prediabetes 03/21/2021   Vitamin D deficiency 08/23/2019   Non-restorative sleep 06/14/2019   Excessive daytime sleepiness 06/14/2019   Inadequate sleep hygiene 06/14/2019   BMI 38.0-38.9,adult 05/23/2019   Snoring 05/23/2019   Osteoporosis 10/01/2018   Family history of diabetes mellitus in first degree relative 10/01/2016   Other fatigue 10/01/2016   Morbid obesity (HCC) 10/01/2016    Past Surgical History:  Procedure Laterality Date   FOOT SURGERY      LAPAROSCOPIC GASTRIC SLEEVE RESECTION N/A 02/26/2021   Procedure: LAPAROSCOPIC GASTRIC SLEEVE RESECTION;  Surgeon: Berna Bue, MD;  Location: [facility];  Service: General;  Laterality: N/A;   UPPER GI ENDOSCOPY N/A 02/26/2021   Procedure: UPPER GI ENDOSCOPY;  Surgeon: Berna Bue, MD;  Location: [facility];  Service: General;  Laterality: N/A;   Social History   Tobacco Use   Smoking status: Never    Passive exposure: Never   Smokeless tobacco: Never  Vaping Use   Vaping Use: Never used  Substance Use Topics   Alcohol use: Not Currently    Comment: occassionally   Drug use: Never   Family History  Problem Relation Age of Onset   Diabetes Mother    Hyperlipidemia Mother    Hypertension Mother    Diabetes Father    Stroke Father    Healthy Sister    Diabetes Brother    Stroke Brother    Seizures Brother    Healthy Brother    Diabetes Maternal Grandmother    No Known Allergies   Patient Care Team: Melida Quitter, PA as PCP - General (Family Medicine) Toni Arthurs, MD as Consulting Physician (Orthopedic Surgery) Julaine Fusi, NP (Family Medicine)   Outpatient Medications Prior to Visit  Medication Sig  calcium carbonate (TUMS EX) 750 MG chewable tablet Chew by mouth.   Multiple Vitamin (MULTIVITAMIN ADULT PO) Take 1 tablet by mouth daily.   Multiple Vitamin (MULTIVITAMIN WITH MINERALS) TABS tablet Take 1 tablet by mouth daily.   pantoprazole (PROTONIX) 40 MG tablet Take 1 tablet (40 mg total) by mouth daily.   tirzepatide [facility]) 2.5 MG/0.5ML Pen Inject 2.5 mg into the skin once a week.   No facility-administered medications prior to visit.    ROS    Objective:    There were no vitals taken for this visit.   Physical Exam   No results found for any visits on 01/29/23.    Assessment & Plan:    Routine Health Maintenance and Physical Exam  Immunization History  Administered Date(s) Administered   Influenza-Unspecified 05/02/2019, 05/23/2022    PFIZER(Purple Top)SARS-COV-2 Vaccination 10/13/2019, 11/03/2019   Tdap 05/23/2019    Health Maintenance  Topic Date Due   COVID-19 Vaccine (3 - 2023-24 season) 04/04/2022   PAP SMEAR-Modifier  06/01/2022   INFLUENZA VACCINE  03/05/2023   DTaP/Tdap/Td (2 - Td or Tdap) 05/22/2029   Hepatitis C Screening  Completed   HIV Screening  Completed   HPV VACCINES  Aged Out    Discussed health benefits of physical activity, and encouraged her to engage in regular exercise appropriate for her age and condition.  There are no diagnoses linked to this encounter.  No follow-ups on file.     Melida Quitter, PA

## 2023-02-26 ENCOUNTER — Encounter: Payer: Self-pay | Admitting: Family Medicine

## 2023-02-26 ENCOUNTER — Ambulatory Visit (INDEPENDENT_AMBULATORY_CARE_PROVIDER_SITE_OTHER): Payer: BC Managed Care – PPO | Admitting: Family Medicine

## 2023-02-26 VITALS — BP 114/73 | HR 78 | Ht 67.0 in | Wt 260.8 lb

## 2023-02-26 DIAGNOSIS — Z1211 Encounter for screening for malignant neoplasm of colon: Secondary | ICD-10-CM | POA: Diagnosis not present

## 2023-02-26 DIAGNOSIS — L680 Hirsutism: Secondary | ICD-10-CM | POA: Insufficient documentation

## 2023-02-26 DIAGNOSIS — Z0000 Encounter for general adult medical examination without abnormal findings: Secondary | ICD-10-CM | POA: Diagnosis not present

## 2023-02-26 DIAGNOSIS — Z1212 Encounter for screening for malignant neoplasm of rectum: Secondary | ICD-10-CM | POA: Diagnosis not present

## 2023-02-26 NOTE — Progress Notes (Signed)
Complete physical exam  Patient: Kathleen Ramos   DOB: 07-18-1976   47 y.o. Female  MRN: [id number]  Subjective:    Chief Complaint  Patient presents with   Annual Exam    Kathleen Ramos is a 47 y.o. female who presents today for a complete physical exam. She reports consuming a general diet. The patient does not participate in regular exercise at present. She generally feels well. She reports sleeping poorly, she has not tried the OTC mouthpiece for sleep apnea yet as she is in the middle of reconstructive dental surgeries. She does not have additional problems to discuss today.    Most recent fall risk assessment:    02/26/2023    2:47 PM  Fall Risk   Falls in the past year? 0  Number falls in past yr: 0  Injury with Fall? 0  Risk for fall due to : No Fall Risks  Follow up Falls evaluation completed     Most recent depression and anxiety screenings:    02/26/2023    2:47 PM 10/21/2022    3:07 PM  PHQ 2/9 Scores  PHQ - 2 Score 0 0  PHQ- 9 Score 1       02/26/2023    2:47 PM 01/28/2022    1:17 PM 01/10/2021    3:53 PM  GAD 7 : Generalized Anxiety Score  Nervous, Anxious, on Edge 0 0 0  Control/stop worrying 0 0 1  Worry too much - different things 0 0 1  Trouble relaxing 0 0 0  Restless 0 0 0  Easily annoyed or irritable 0 0 0  Afraid - awful might happen 0 0 0  Total GAD 7 Score 0 0 2  Anxiety Difficulty  Not difficult at all Not difficult at all    Patient Active Problem List   Diagnosis Date Noted   Hirsutism 02/26/2023   PCOS (polycystic ovarian syndrome) 10/23/2022   Anemia 03/21/2021   History of depression 03/21/2021   Hyperlipidemia 03/21/2021   Obstructive sleep apnea 03/21/2021   Prediabetes 03/21/2021   Vitamin D deficiency 08/23/2019   Non-restorative sleep 06/14/2019   Excessive daytime sleepiness 06/14/2019   Inadequate sleep hygiene 06/14/2019   BMI 38.0-38.9,adult 05/23/2019   Snoring 05/23/2019   Osteoporosis 10/01/2018   Family  history of diabetes mellitus in first degree relative 10/01/2016   Other fatigue 10/01/2016   Morbid obesity (HCC) 10/01/2016    Past Surgical History:  Procedure Laterality Date   FOOT SURGERY     LAPAROSCOPIC GASTRIC SLEEVE RESECTION N/A 02/26/2021   Procedure: LAPAROSCOPIC GASTRIC SLEEVE RESECTION;  Surgeon: Berna Bue, MD;  Location: [facility];  Service: General;  Laterality: N/A;   UPPER GI ENDOSCOPY N/A 02/26/2021   Procedure: UPPER GI ENDOSCOPY;  Surgeon: Berna Bue, MD;  Location: [facility];  Service: General;  Laterality: N/A;   Social History   Tobacco Use   Smoking status: Never    Passive exposure: Never   Smokeless tobacco: Never  Vaping Use   Vaping status: Never Used  Substance Use Topics   Alcohol use: Not Currently    Comment: occassionally   Drug use: Never   Family History  Problem Relation Age of Onset   Diabetes Mother    Hyperlipidemia Mother    Hypertension Mother    Diabetes Father    Stroke Father    Healthy Sister    Diabetes Brother    Stroke Brother    Seizures Brother  Healthy Brother    Diabetes Maternal Grandmother    No Known Allergies   Patient Care Team: Melida Quitter, PA as PCP - General (Family Medicine) Toni Arthurs, MD as Consulting Physician (Orthopedic Surgery) Julaine Fusi, NP (Family Medicine)   Outpatient Medications Prior to Visit  Medication Sig   calcium carbonate (TUMS EX) 750 MG chewable tablet Chew by mouth.   Multiple Vitamin (MULTIVITAMIN ADULT PO) Take 1 tablet by mouth daily.   Multiple Vitamin (MULTIVITAMIN WITH MINERALS) TABS tablet Take 1 tablet by mouth daily.   pantoprazole (PROTONIX) 40 MG tablet Take 1 tablet (40 mg total) by mouth daily.   tirzepatide [facility]) 2.5 MG/0.5ML Pen Inject 2.5 mg into the skin once a week.   No facility-administered medications prior to visit.    Review of Systems  Constitutional:  Negative for chills, fever and malaise/fatigue.  HENT:  Negative for  congestion and hearing loss.   Eyes:  Negative for blurred vision and double vision.  Respiratory:  Negative for cough and shortness of breath.   Cardiovascular:  Negative for chest pain, palpitations and leg swelling.  Gastrointestinal:  Negative for abdominal pain, constipation, diarrhea and heartburn.  Genitourinary:  Negative for frequency and urgency.  Musculoskeletal:  Negative for myalgias and neck pain.  Neurological:  Negative for headaches.  Endo/Heme/Allergies:  Negative for polydipsia.  Psychiatric/Behavioral:  Negative for depression. The patient has insomnia. The patient is not nervous/anxious.       Objective:    BP 114/73   Pulse 78   Ht 5\' 7"  (1.702 m)   Wt 260 lb 12 oz (118.3 kg)   LMP 01/12/2023   SpO2 96%   BMI 40.84 kg/m    Physical Exam Constitutional:      General: She is not in acute distress.    Appearance: Normal appearance.  HENT:     Head: Normocephalic and atraumatic.     Right Ear: Tympanic membrane, ear canal and external ear normal.     Left Ear: Tympanic membrane, ear canal and external ear normal.     Nose: Nose normal.     Mouth/Throat:     Mouth: Mucous membranes are moist.     Pharynx: No oropharyngeal exudate or posterior oropharyngeal erythema.  Eyes:     Extraocular Movements: Extraocular movements intact.     Conjunctiva/sclera: Conjunctivae normal.     Pupils: Pupils are equal, round, and reactive to light.  Neck:     Thyroid: No thyroid mass, thyromegaly or thyroid tenderness.  Cardiovascular:     Rate and Rhythm: Normal rate and regular rhythm.     Heart sounds: Normal heart sounds. No murmur heard.    No friction rub. No gallop.  Pulmonary:     Effort: Pulmonary effort is normal. No respiratory distress.     Breath sounds: Normal breath sounds. No wheezing, rhonchi or rales.  Abdominal:     General: Abdomen is flat. Bowel sounds are normal. There is no distension.     Palpations: There is no mass.     Tenderness: There  is no abdominal tenderness. There is no guarding.  Musculoskeletal:        General: Normal range of motion.     Cervical back: Normal range of motion and neck supple.  Lymphadenopathy:     Cervical: No cervical adenopathy.  Skin:    General: Skin is warm and dry.  Neurological:     Mental Status: She is alert and oriented to person, place,  and time.     Cranial Nerves: No cranial nerve deficit.     Motor: No weakness.     Deep Tendon Reflexes: Reflexes normal.  Psychiatric:        Mood and Affect: Mood normal.        Assessment & Plan:    Routine Health Maintenance and Physical Exam  Immunization History  Administered Date(s) Administered   Influenza-Unspecified 05/02/2019, 05/23/2022   PFIZER(Purple Top)SARS-COV-2 Vaccination 10/13/2019, 11/03/2019   Tdap 05/23/2019    Health Maintenance  Topic Date Due   MAMMOGRAM  06/01/2021   COVID-19 Vaccine (3 - 2023-24 season) 04/04/2022   Colonoscopy  Never done   INFLUENZA VACCINE  03/05/2023   PAP SMEAR-Modifier  06/01/2024   DTaP/Tdap/Td (2 - Td or Tdap) 05/22/2029   Hepatitis C Screening  Completed   HIV Screening  Completed   HPV VACCINES  Aged Out   Pap smear up-to-date.  She is aware that she is due for mammogram and has not on her to do list to get scheduled.  We discussed the recommendations and options for colorectal cancer screening.  She is opted for colonoscopy.  Referral has been placed.  Discussed health benefits of physical activity, and encouraged her to engage in regular exercise appropriate for her age and condition.  Wellness examination  Screening for colorectal cancer -     Ambulatory referral to Gastroenterology    Return in about 4 months (around 06/29/2023) for follow-up for sleep.     Melida Quitter, PA

## 2023-05-07 ENCOUNTER — Encounter: Payer: Self-pay | Admitting: Family Medicine

## 2023-05-08 ENCOUNTER — Telehealth: Payer: Self-pay

## 2023-05-08 ENCOUNTER — Encounter: Payer: Self-pay | Admitting: "Endocrinology

## 2023-05-08 ENCOUNTER — Ambulatory Visit (INDEPENDENT_AMBULATORY_CARE_PROVIDER_SITE_OTHER): Payer: BC Managed Care – PPO | Admitting: "Endocrinology

## 2023-05-08 VITALS — BP 123/80 | HR 93 | Ht 67.0 in | Wt 256.8 lb

## 2023-05-08 DIAGNOSIS — E7800 Pure hypercholesterolemia, unspecified: Secondary | ICD-10-CM | POA: Diagnosis not present

## 2023-05-08 DIAGNOSIS — E282 Polycystic ovarian syndrome: Secondary | ICD-10-CM

## 2023-05-08 MED ORDER — OZEMPIC (0.25 OR 0.5 MG/DOSE) 2 MG/3ML ~~LOC~~ SOPN: 0.2500 mg | PEN_INJECTOR | SUBCUTANEOUS | 1 refills | Status: DC

## 2023-05-08 NOTE — Telephone Encounter (Signed)
Per Patient Semaglutide,0.25 or 0.5MG /DOS, (OZEMPIC, 0.25 OR 0.5 MG/DOSE,) 2 MG/3ML SOPN [[id number]]  Is requiring a prior auth

## 2023-05-08 NOTE — Progress Notes (Signed)
Outpatient Endocrinology Note Altamese [place], MD    Kathleen Ramos 1975-12-25 [id number]  Referring Provider: Melida Quitter, PA Primary Care Provider: Melida Quitter, PA Reason for consultation: PCOS   Assessment & Plan  Diagnoses and all orders for this visit:  PCOS (polycystic ovarian syndrome)  Morbid obesity (HCC)  Pure hypercholesterolemia  Other orders -     Semaglutide,0.25 or 0.5MG /DOS, (OZEMPIC, 0.25 OR 0.5 MG/DOSE,) 2 MG/3ML SOPN; Inject 0.25 mg into the skin once a week.    Grade 2 obesity, complicated by hypercholesterolemia, A1C and LFTs WNL Patient has history of gastric sleeve in 2022, lost 40 pounds and gained back 5 pounds No history of MEN syndrome/medullary thyroid cancer/pancreatitis or pancreatic cancer in self or family Patient is interested in [place] but it is not covered  Ordered Ozempic 0.25 mg weekly  PCOS diagnosis clinically based off of irregular menstrual cycle and excessive facial and neck hair Ferryman Gallwey score is 6 Patient is open to metformin after she has figured out if ozempic is covered  Discussed previously lifestyle changes, medical management as well as bariatric surgery Maintain healthy lifestyle including 1200 Cal/day, 30 min of activity/day, avoiding refined/processed/outside food 20 minutes physical activity per day, in continuum or interruptedly through the day  Goals: less than 60 grams of carbohydrate/meal, 1200-1500 Cal/day, 10,0000 steps a day and weight loss of 0.5-1 lb/ wk    Return in about 1 month (around 06/09/2023) for tele-visit: 3:20 pm.   Altamese , MD  05/08/23   History of Present Illness HPI  Kathleen Ramos is a 47 y.o. year old female who presents to follow up on PCOS, prediabetes and obesity.  Kathleen Ramos was first diagnosed of PCOS in 2022.    Reports feeling well otherwise Greggory Keen was denied by insurance Lost 4 lbs recently  Had menarche around age 52 Were  regular period until 45s  Has irregular period since  Not on any birth control - pt chose not to be on it  Has excessive hair on chin and neck Waxes hair, feels they are taken care of   S/p Laparoscopic gastric sleeve resection in 02/26/2021. Lost 40 lbs with it and gained 5 lbs back.  Physical Exam  BP 123/80   Pulse 93   Ht 5\' 7"  (1.702 m)   Wt 256 lb 12.8 oz (116.5 kg)   SpO2 99%   BMI 40.22 kg/m    Constitutional: well developed, well nourished Head: normocephalic, atraumatic Eyes: sclera anicteric, no redness Neck: supple, hair noted on the chin and neck Lungs: normal respiratory effort Neurology: alert and oriented Skin: dry, no appreciable rashes Musculoskeletal: no appreciable defects Psychiatric: normal mood and affect   Current Medications Patient's Medications  New Prescriptions   SEMAGLUTIDE,0.25 OR 0.5MG /DOS, (OZEMPIC, 0.25 OR 0.5 MG/DOSE,) 2 MG/3ML SOPN    Inject 0.25 mg into the skin once a week.  Previous Medications   CALCIUM CARBONATE (TUMS EX) 750 MG CHEWABLE TABLET    Chew by mouth.   MULTIPLE VITAMIN (MULTIVITAMIN ADULT PO)    Take 1 tablet by mouth daily.   PANTOPRAZOLE (PROTONIX) 40 MG TABLET    Take 1 tablet (40 mg total) by mouth daily.  Modified Medications   No medications on file  Discontinued Medications   MULTIPLE VITAMIN (MULTIVITAMIN WITH MINERALS) TABS TABLET    Take 1 tablet by mouth daily.   TIRZEPATIDE (MOUNJARO) 2.5 MG/0.5ML PEN    Inject 2.5 mg into the skin once  a week.    Allergies No Known Allergies  Past Medical History Past Medical History:  Diagnosis Date   Anemia    Arthritis    Complication of anesthesia    hard to wake up 2018   Sleep apnea     Past Surgical History Past Surgical History:  Procedure Laterality Date   FOOT SURGERY     LAPAROSCOPIC GASTRIC SLEEVE RESECTION N/A 02/26/2021   Procedure: LAPAROSCOPIC GASTRIC SLEEVE RESECTION;  Surgeon: Berna Bue, MD;  Location: [facility];  Service: General;   Laterality: N/A;   UPPER GI ENDOSCOPY N/A 02/26/2021   Procedure: UPPER GI ENDOSCOPY;  Surgeon: Berna Bue, MD;  Location: [facility];  Service: General;  Laterality: N/A;    Family History family history includes Diabetes in her brother, father, maternal grandmother, and mother; Healthy in her brother and sister; Hyperlipidemia in her mother; Hypertension in her mother; Seizures in her brother; Stroke in her brother and father.  Social History Social History   Socioeconomic History   Marital status: Married    Spouse name: Not on file   Number of children: Not on file   Years of education: Not on file   Highest education level: Not on file  Occupational History   Not on file  Tobacco Use   Smoking status: Never    Passive exposure: Never   Smokeless tobacco: Never  Vaping Use   Vaping status: Never Used  Substance and Sexual Activity   Alcohol use: Not Currently    Comment: occassionally   Drug use: Never   Sexual activity: Yes    Birth control/protection: None  Other Topics Concern   Not on file  Social History Narrative   ** Merged History Encounter **       Social Determinants of Health   Financial Resource Strain: Not on file  Food Insecurity: Not on file  Transportation Needs: Not on file  Physical Activity: Not on file  Stress: Not on file  Social Connections: Not on file  Intimate Partner Violence: Not on file    Lab Results  Component Value Date   CHOL 211 (H) 12/08/2022   Lab Results  Component Value Date   HDL 67 12/08/2022   Lab Results  Component Value Date   LDLCALC 123 (H) 12/08/2022   Lab Results  Component Value Date   TRIG 104 12/08/2022   Lab Results  Component Value Date   CHOLHDL 3.1 12/08/2022   Lab Results  Component Value Date   CREATININE 0.79 12/08/2022   Lab Results  Component Value Date   GFR 90.72 12/08/2022      Component Value Date/Time   NA 138 12/08/2022 1355   NA 139 10/21/2022 1535   K 4.4 12/08/2022  1355   CL 105 12/08/2022 1355   CO2 25 12/08/2022 1355   GLUCOSE 82 12/08/2022 1355   BUN 18 12/08/2022 1355   BUN 13 10/21/2022 1535   CREATININE 0.79 12/08/2022 1355   CALCIUM 9.2 12/08/2022 1355   PROT 7.2 12/08/2022 1355   PROT 7.1 10/21/2022 1535   ALBUMIN 3.8 12/08/2022 1355   ALBUMIN 4.0 10/21/2022 1535   AST 19 12/08/2022 1355   ALT 15 12/08/2022 1355   ALKPHOS 50 12/08/2022 1355   BILITOT 0.3 12/08/2022 1355   BILITOT <0.2 10/21/2022 1535   GFRNONAA >60 02/27/2021 0421   GFRAA 132 12/05/2019 1454      Latest Ref Rng & Units 12/08/2022    1:55 PM 10/21/2022  3:35 PM 01/28/2022    1:48 PM  BMP  Glucose 70 - 99 mg/dL 82  086  77   BUN 6 - 23 mg/dL 18  13  11    Creatinine 0.40 - 1.20 mg/dL 5.78  4.69  6.29   BUN/Creat Ratio 9 - 23  20  17    Sodium 135 - 145 mEq/L 138  139  140   Potassium 3.5 - 5.1 mEq/L 4.4  4.3  4.2   Chloride 96 - 112 mEq/L 105  107  105   CO2 19 - 32 mEq/L 25  18  20    Calcium 8.4 - 10.5 mg/dL 9.2  8.7  9.0        Component Value Date/Time   WBC 6.8 10/21/2022 1535   WBC 9.6 02/27/2021 0421   RBC 4.72 10/21/2022 1535   RBC 4.37 02/27/2021 0421   HGB 12.5 10/21/2022 1535   HCT 38.9 10/21/2022 1535   PLT 249 10/21/2022 1535   MCV 82 10/21/2022 1535   MCH 26.5 (L) 10/21/2022 1535   MCH 26.3 02/27/2021 0421   MCHC 32.1 10/21/2022 1535   MCHC 30.6 02/27/2021 0421   RDW 14.4 10/21/2022 1535   LYMPHSABS 2.3 10/21/2022 1535   MONOABS 0.3 02/27/2021 0421   EOSABS 0.1 10/21/2022 1535   BASOSABS 0.0 10/21/2022 1535     Parts of this note may have been dictated using voice recognition software. There may be variances in spelling and vocabulary which are unintentional. Not all errors are proofread. Please notify the Thereasa Parkin if any discrepancies are noted or if the meaning of any statement is not clear.

## 2023-05-11 NOTE — Telephone Encounter (Signed)
Class 2 severe obesity due to excess calories with serious comorbidity and body mass index (BMI) of 39.0 to 39.9 in adult E66.01

## 2023-05-29 NOTE — Telephone Encounter (Signed)
Unfortunately that is not an accepted diagnosis for Ozempic.   A PA was submitted, but not by the PA team.  That was denied. Please see denial reason below:     Labs showing A1c at or above 6.5 are required.

## 2023-05-29 NOTE — Telephone Encounter (Signed)
Noted thank you Emelia Loron for your help

## 2023-06-08 ENCOUNTER — Encounter: Payer: Self-pay | Admitting: "Endocrinology

## 2023-06-08 ENCOUNTER — Telehealth (INDEPENDENT_AMBULATORY_CARE_PROVIDER_SITE_OTHER): Payer: BC Managed Care – PPO | Admitting: "Endocrinology

## 2023-06-08 DIAGNOSIS — E7800 Pure hypercholesterolemia, unspecified: Secondary | ICD-10-CM | POA: Diagnosis not present

## 2023-06-08 DIAGNOSIS — E282 Polycystic ovarian syndrome: Secondary | ICD-10-CM | POA: Diagnosis not present

## 2023-06-08 MED ORDER — METFORMIN HCL 500 MG PO TABS: 1000.0000 mg | ORAL_TABLET | Freq: Two times a day (BID) | ORAL | 5 refills | Status: DC

## 2023-06-08 NOTE — Progress Notes (Signed)
The patient reports they are currently: Kathleen Ramos. I spent 6 minutes on the video with the patient on the date of service. I spent an additional 10 minutes on pre- and post-visit activities on the date of service.   The patient was physically located in [place] or a state in which I am permitted to provide care. The patient and/or parent/guardian understood that s/he may incur co-pays and cost sharing, and agreed to the telemedicine visit. The visit was reasonable and appropriate under the circumstances given the patient's presentation at the time.  The patient and/or parent/guardian has been advised of the potential risks and limitations of this mode of treatment (including, but not limited to, the absence of in-person examination) and has agreed to be treated using telemedicine. The patient's/patient's family's questions regarding telemedicine have been answered.   The patient and/or parent/guardian has also been advised to contact their provider's office for worsening conditions, and seek emergency medical treatment and/or call 911 if the patient deems either necessary.     Outpatient Endocrinology Note Altamese Aragon, MD    ANAYI BRICCO 1976/02/15 [id number]  Referring Provider: Melida Quitter, PA Primary Care Provider: Melida Quitter, PA Reason for consultation: PCOS   Assessment & Plan  Diagnoses and all orders for this visit:  PCOS (polycystic ovarian syndrome)  Pure hypercholesterolemia  Other orders -     metFORMIN (GLUCOPHAGE) 500 MG tablet; Take 2 tablets (1,000 mg total) by mouth 2 (two) times daily with a meal.     Grade 2 obesity, complicated by hypercholesterolemia (A1C and LFTs WNL) Patient has history of gastric sleeve in 2022, lost 40 pounds and gained back 5 pounds No history of MEN syndrome/medullary thyroid cancer/pancreatitis or pancreatic cancer in self or family Patient is interested in [place] but it is not covered  Ozempic is not covered  by [employer] metformin 500 mg every day in escalating dose (500 mg weekly until 1000 mg bid)   PCOS diagnosis clinically based off of irregular menstrual cycle and excessive facial and neck hair Ferryman Gallwey score is 6  Discussed previously lifestyle changes, medical management as well as bariatric surgery Maintain healthy lifestyle including 1200 Cal/day, 30 min of activity/day, avoiding refined/processed/outside food 20 minutes physical activity per day, in continuum or interruptedly through the day  Goals: less than 60 grams of carbohydrate/meal, 1200-1500 Cal/day, 10,0000 steps a day and weight loss of 0.5-1 lb/ wk    Return in about 3 months (around 09/08/2023).   Altamese [place], MD  06/08/23   History of Present Illness HPI  QIARA Ramos is a 47 y.o. year old female who presents to follow up on PCOS, prediabetes and obesity.  [name] was first diagnosed of PCOS in 2022.    Reports feeling well otherwise Greggory Keen was denied by insurance Lost 4 lbs recently  Had menarche around age 82 Were regular period until 30s  Has irregular period since  Not on any birth control - pt chose not to be on it  Has excessive hair on chin and neck Waxes hair, feels they are taken care of   S/p Laparoscopic gastric sleeve resection in 02/26/2021. Lost 40 lbs with it and gained 5 lbs back.  Physical Exam  There were no vitals taken for this visit.   Constitutional: well developed, well nourished Head: normocephalic, atraumatic Eyes: sclera anicteric, no redness Neck: supple, hair noted on the chin and neck Lungs: normal respiratory effort Neurology: alert and oriented Skin: dry,  no appreciable rashes Musculoskeletal: no appreciable defects Psychiatric: normal mood and affect   Current Medications Patient's Medications  New Prescriptions   METFORMIN (GLUCOPHAGE) 500 MG TABLET    Take 2 tablets (1,000 mg total) by mouth 2 (two) times daily with a meal.   Previous Medications   CALCIUM CARBONATE (TUMS EX) 750 MG CHEWABLE TABLET    Chew by mouth.   MULTIPLE VITAMIN (MULTIVITAMIN ADULT PO)    Take 1 tablet by mouth daily.   PANTOPRAZOLE (PROTONIX) 40 MG TABLET    Take 1 tablet (40 mg total) by mouth daily.   SEMAGLUTIDE,0.25 OR 0.5MG /DOS, (OZEMPIC, 0.25 OR 0.5 MG/DOSE,) 2 MG/3ML SOPN    Inject 0.25 mg into the skin once a week.  Modified Medications   No medications on file  Discontinued Medications   No medications on file    Allergies No Known Allergies  Past Medical History Past Medical History:  Diagnosis Date   Anemia    Arthritis    Complication of anesthesia    hard to wake up 2018   Sleep apnea     Past Surgical History Past Surgical History:  Procedure Laterality Date   FOOT SURGERY     LAPAROSCOPIC GASTRIC SLEEVE RESECTION N/A 02/26/2021   Procedure: LAPAROSCOPIC GASTRIC SLEEVE RESECTION;  Surgeon: Berna Bue, MD;  Location: [facility];  Service: General;  Laterality: N/A;   UPPER GI ENDOSCOPY N/A 02/26/2021   Procedure: UPPER GI ENDOSCOPY;  Surgeon: Berna Bue, MD;  Location: [facility];  Service: General;  Laterality: N/A;    Family History family history includes Diabetes in her brother, father, maternal grandmother, and mother; Healthy in her brother and sister; Hyperlipidemia in her mother; Hypertension in her mother; Seizures in her brother; Stroke in her brother and father.  Social History Social History   Socioeconomic History   Marital status: Married    Spouse name: Not on file   Number of children: Not on file   Years of education: Not on file   Highest education level: Not on file  Occupational History   Not on file  Tobacco Use   Smoking status: Never    Passive exposure: Never   Smokeless tobacco: Never  Vaping Use   Vaping status: Never Used  Substance and Sexual Activity   Alcohol use: Not Currently    Comment: occassionally   Drug use: Never   Sexual activity: Yes    Birth  control/protection: None  Other Topics Concern   Not on file  Social History Narrative   ** Merged History Encounter **       Social Determinants of Health   Financial Resource Strain: Not on file  Food Insecurity: Not on file  Transportation Needs: Not on file  Physical Activity: Not on file  Stress: Not on file  Social Connections: Not on file  Intimate Partner Violence: Not on file    Lab Results  Component Value Date   CHOL 211 (H) 12/08/2022   Lab Results  Component Value Date   HDL 67 12/08/2022   Lab Results  Component Value Date   LDLCALC 123 (H) 12/08/2022   Lab Results  Component Value Date   TRIG 104 12/08/2022   Lab Results  Component Value Date   CHOLHDL 3.1 12/08/2022   Lab Results  Component Value Date   CREATININE 0.79 12/08/2022   Lab Results  Component Value Date   GFR 90.72 12/08/2022      Component Value Date/Time  NA 138 12/08/2022 1355   NA 139 10/21/2022 1535   K 4.4 12/08/2022 1355   CL 105 12/08/2022 1355   CO2 25 12/08/2022 1355   GLUCOSE 82 12/08/2022 1355   BUN 18 12/08/2022 1355   BUN 13 10/21/2022 1535   CREATININE 0.79 12/08/2022 1355   CALCIUM 9.2 12/08/2022 1355   PROT 7.2 12/08/2022 1355   PROT 7.1 10/21/2022 1535   ALBUMIN 3.8 12/08/2022 1355   ALBUMIN 4.0 10/21/2022 1535   AST 19 12/08/2022 1355   ALT 15 12/08/2022 1355   ALKPHOS 50 12/08/2022 1355   BILITOT 0.3 12/08/2022 1355   BILITOT <0.2 10/21/2022 1535   GFRNONAA >60 02/27/2021 0421   GFRAA 132 12/05/2019 1454      Latest Ref Rng & Units 12/08/2022    1:55 PM 10/21/2022    3:35 PM 01/28/2022    1:48 PM  BMP  Glucose 70 - 99 mg/dL 82  433  77   BUN 6 - 23 mg/dL 18  13  11    Creatinine 0.40 - 1.20 mg/dL 2.95  1.88  4.16   BUN/Creat Ratio 9 - 23  20  17    Sodium 135 - 145 mEq/L 138  139  140   Potassium 3.5 - 5.1 mEq/L 4.4  4.3  4.2   Chloride 96 - 112 mEq/L 105  107  105   CO2 19 - 32 mEq/L 25  18  20    Calcium 8.4 - 10.5 mg/dL 9.2  8.7  9.0         Component Value Date/Time   WBC 6.8 10/21/2022 1535   WBC 9.6 02/27/2021 0421   RBC 4.72 10/21/2022 1535   RBC 4.37 02/27/2021 0421   HGB 12.5 10/21/2022 1535   HCT 38.9 10/21/2022 1535   PLT 249 10/21/2022 1535   MCV 82 10/21/2022 1535   MCH 26.5 (L) 10/21/2022 1535   MCH 26.3 02/27/2021 0421   MCHC 32.1 10/21/2022 1535   MCHC 30.6 02/27/2021 0421   RDW 14.4 10/21/2022 1535   LYMPHSABS 2.3 10/21/2022 1535   MONOABS 0.3 02/27/2021 0421   EOSABS 0.1 10/21/2022 1535   BASOSABS 0.0 10/21/2022 1535     Parts of this note may have been dictated using voice recognition software. There may be variances in spelling and vocabulary which are unintentional. Not all errors are proofread. Please notify the Thereasa Parkin if any discrepancies are noted or if the meaning of any statement is not clear.

## 2023-06-08 NOTE — Patient Instructions (Addendum)
We will start you on an metformin. Start with one 500 mg capsule taken every evening with dinner. Stay on this for 3-5 days, then increase to 1 tablet with breakfast and one with dinner. If you do not have any upset stomach, gradually increase to the goal dose of 2 capsules with breakfast and 2 capsules with dinner. If you do have upset stomach, decrease it back to the previous dose that you tolerated.  Week 1: one tablet after dinner Week 2: one tablet after breakfast and one after dinner  Week 3: one tablet after breakfast and two after dinner  Week 4 and onwards: two tablets after breakfast and two after dinner   Maintain healthy lifestyle including 1200 Cal/day, 30 min of activity/day, avoiding refined/processed/outside food 20 minutes physical activity per day, in continuum or interruptedly through the day  Goals: less than 60 grams of carbohydrate/meal, 1200-1500 Cal/day, 10,0000 steps a day and weight loss of 0.5-1 lb/ wk

## 2023-06-29 ENCOUNTER — Ambulatory Visit (INDEPENDENT_AMBULATORY_CARE_PROVIDER_SITE_OTHER): Payer: BC Managed Care – PPO | Admitting: Family Medicine

## 2023-06-29 ENCOUNTER — Encounter: Payer: Self-pay | Admitting: Family Medicine

## 2023-06-29 VITALS — BP 99/61 | HR 75 | Resp 18 | Ht 67.0 in | Wt 264.0 lb

## 2023-06-29 DIAGNOSIS — Z1212 Encounter for screening for malignant neoplasm of rectum: Secondary | ICD-10-CM

## 2023-06-29 DIAGNOSIS — G4733 Obstructive sleep apnea (adult) (pediatric): Secondary | ICD-10-CM

## 2023-06-29 DIAGNOSIS — Z1231 Encounter for screening mammogram for malignant neoplasm of breast: Secondary | ICD-10-CM | POA: Diagnosis not present

## 2023-06-29 DIAGNOSIS — Z1211 Encounter for screening for malignant neoplasm of colon: Secondary | ICD-10-CM

## 2023-06-29 NOTE — Patient Instructions (Addendum)
I hope that cutting back on work some will be beneficial!  I look forward to checking in with you in a few months to see how that and the mouthpiece are working for you :)  I am also hopeful that metformin will be a good fit for you, but if not there are other options available that you can talk to your endocrinologist about!

## 2023-06-29 NOTE — Assessment & Plan Note (Signed)
Recommend trial of over-the-counter mouthpiece as alternative to CPAP.  If effective then can ask dentist for prescription mouthpiece at upcoming appointment.

## 2023-06-29 NOTE — Progress Notes (Signed)
   Established Patient Office Visit  Subjective   Patient ID: Kathleen Ramos, female    DOB: October 10, 1975  Age: 47 y.o. MRN: [id number]  Chief Complaint  Patient presents with   Sleep Apnea    HPI Kathleen Ramos is a 47 y.o. female presenting today for follow up of sleep apnea.  As of her last appointment, she had not tried the OTC mouthpiece for sleep apnea while in the middle of reconstructive dental surgeries.  She still has not had a chance to try the mouthpiece but is planning on doing so within the next couple of weeks.  She is also planning on cutting back on her work schedule at the beginning of 2025 and is hopeful that this will help with her sleep and ongoing fatigue.  Outpatient Medications Prior to Visit  Medication Sig   calcium carbonate (TUMS EX) 750 MG chewable tablet Chew by mouth.   metFORMIN (GLUCOPHAGE) 500 MG tablet Take 2 tablets (1,000 mg total) by mouth 2 (two) times daily with a meal.   Multiple Vitamin (MULTIVITAMIN ADULT PO) Take 1 tablet by mouth daily.   pantoprazole (PROTONIX) 40 MG tablet Take 1 tablet (40 mg total) by mouth daily.   Semaglutide,0.25 or 0.5MG /DOS, (OZEMPIC, 0.25 OR 0.5 MG/DOSE,) 2 MG/3ML SOPN Inject 0.25 mg into the skin once a week.   No facility-administered medications prior to visit.    ROS Negative unless otherwise noted in HPI   Objective:     BP 99/61 (BP Location: Left Arm, Patient Position: Sitting, Cuff Size: Large)   Pulse 75   Resp 18   Ht 5\' 7"  (1.702 m)   Wt 264 lb (119.7 kg)   SpO2 97%   BMI 41.35 kg/m   Physical Exam Constitutional:      General: She is not in acute distress.    Appearance: Normal appearance.  HENT:     Head: Normocephalic and atraumatic.  Pulmonary:     Effort: Pulmonary effort is normal. No respiratory distress.  Musculoskeletal:     Cervical back: Normal range of motion.  Neurological:     General: No focal deficit present.     Mental Status: She is alert and oriented to person,  place, and time. Mental status is at baseline.  Psychiatric:        Mood and Affect: Mood normal.        Thought Content: Thought content normal.        Judgment: Judgment normal.     Assessment & Plan:  Obstructive sleep apnea Assessment & Plan: Recommend trial of over-the-counter mouthpiece as alternative to CPAP.  If effective then can ask dentist for prescription mouthpiece at upcoming appointment.   Screening mammogram for breast cancer -     3D Screening Mammogram, Left and Right; Future  Screening for colorectal cancer -     Ambulatory referral to Gastroenterology    Return in about 3 months (around 09/29/2023) for follow-up for sleep apnea.    Melida Quitter, PA

## 2023-07-31 ENCOUNTER — Ambulatory Visit: Payer: BC Managed Care – PPO

## 2023-07-31 ENCOUNTER — Ambulatory Visit
Admission: RE | Admit: 2023-07-31 | Discharge: 2023-07-31 | Disposition: A | Discharge status: Home / Self Care | Payer: BC Managed Care – PPO | Source: Ambulatory Visit | Attending: Family Medicine

## 2023-07-31 DIAGNOSIS — Z1231 Encounter for screening mammogram for malignant neoplasm of breast: Secondary | ICD-10-CM | POA: Diagnosis not present

## 2023-09-10 ENCOUNTER — Encounter: Payer: Self-pay | Admitting: Family Medicine

## 2023-09-29 ENCOUNTER — Ambulatory Visit (INDEPENDENT_AMBULATORY_CARE_PROVIDER_SITE_OTHER): Payer: BC Managed Care – PPO | Admitting: Family Medicine

## 2023-09-29 ENCOUNTER — Encounter: Payer: Self-pay | Admitting: Family Medicine

## 2023-09-29 VITALS — BP 126/73 | HR 96 | Ht 67.0 in | Wt 263.0 lb

## 2023-09-29 DIAGNOSIS — G4733 Obstructive sleep apnea (adult) (pediatric): Secondary | ICD-10-CM | POA: Diagnosis not present

## 2023-09-29 NOTE — Progress Notes (Signed)
   Established Patient Office Visit  Subjective   Patient ID: Kathleen Ramos, female    DOB: 1976/02/02  Age: 48 y.o. MRN: [id number]  Chief Complaint  Patient presents with   Follow Up Sleep Apnea    HPI Kathleen Ramos is a 48 y.o. female presenting today for follow up of sleep apnea.  At her last appointment, she had not yet tried the OTC mouthpiece for sleep apnea due to reconstructive dental surgeries.  Advised her to try the OTC mouthpiece prior to follow-up, she still has it waiting in her [employer] cart.  Outpatient Medications Prior to Visit  Medication Sig   calcium carbonate (TUMS EX) 750 MG chewable tablet Chew by mouth.   metFORMIN (GLUCOPHAGE) 500 MG tablet Take 2 tablets (1,000 mg total) by mouth 2 (two) times daily with a meal.   Multiple Vitamin (MULTIVITAMIN ADULT PO) Take 1 tablet by mouth daily.   pantoprazole (PROTONIX) 40 MG tablet Take 1 tablet (40 mg total) by mouth daily.   [DISCONTINUED] Semaglutide,0.25 or 0.5MG /DOS, (OZEMPIC, 0.25 OR 0.5 MG/DOSE,) 2 MG/3ML SOPN Inject 0.25 mg into the skin once a week.   No facility-administered medications prior to visit.    ROS Negative unless otherwise noted in HPI   Objective:     BP 126/73   Pulse 96   Ht 5\' 7"  (1.702 m)   Wt 263 lb (119.3 kg)   LMP 09/25/2023 (Exact Date)   SpO2 97%   BMI 41.19 kg/m   Physical Exam Constitutional:      General: She is not in acute distress.    Appearance: Normal appearance.  HENT:     Head: Normocephalic and atraumatic.  Pulmonary:     Effort: Pulmonary effort is normal. No respiratory distress.  Musculoskeletal:     Cervical back: Normal range of motion.  Neurological:     General: No focal deficit present.     Mental Status: She is alert and oriented to person, place, and time. Mental status is at baseline.  Psychiatric:        Mood and Affect: Mood normal.        Thought Content: Thought content normal.        Judgment: Judgment normal.      Assessment &  Plan:  Obstructive sleep apnea Assessment & Plan: Patient is adamant that she does not want to use CPAP.  Discussed that if she would like to avoid CPAP, she will need to order the mouthpiece to see if it works.  If effective then she can ask her dentist for a prescription mouthpiece.  Discussed the importance of treating OSA to avoid complications such as increased risk of motor vehicle crashes associated with untreated OSA or cardiovascular disease like HTN, arrhythmia, stroke, MI.  Patient will need to find internal motivation to start improving OSA and resulting symptoms.     Return in about 5 months (around 02/26/2024) for annual physical, fasting labs 1 week before.    Melida Quitter, PA

## 2023-09-29 NOTE — Patient Instructions (Signed)
 HOMEWORK: Order your mouthpiece for sleep apnea!!!!

## 2023-09-29 NOTE — Assessment & Plan Note (Addendum)
 Patient is adamant that she does not want to use CPAP.  Discussed that if she would like to avoid CPAP, she will need to order the mouthpiece to see if it works.  If effective then she can ask her dentist for a prescription mouthpiece.  Discussed the importance of treating OSA to avoid complications such as increased risk of motor vehicle crashes associated with untreated OSA or cardiovascular disease like HTN, arrhythmia, stroke, MI.  Patient will need to find internal motivation to start improving OSA and resulting symptoms.

## 2024-05-06 ENCOUNTER — Ambulatory Visit (INDEPENDENT_AMBULATORY_CARE_PROVIDER_SITE_OTHER)

## 2024-05-06 VITALS — BP 119/75 | HR 64 | Temp 97.6°F | Ht 67.0 in | Wt 253.1 lb

## 2024-05-06 DIAGNOSIS — R7303 Prediabetes: Secondary | ICD-10-CM

## 2024-05-06 DIAGNOSIS — Z1211 Encounter for screening for malignant neoplasm of colon: Secondary | ICD-10-CM

## 2024-05-06 DIAGNOSIS — Z0000 Encounter for general adult medical examination without abnormal findings: Secondary | ICD-10-CM | POA: Insufficient documentation

## 2024-05-06 DIAGNOSIS — E282 Polycystic ovarian syndrome: Secondary | ICD-10-CM

## 2024-05-06 DIAGNOSIS — E785 Hyperlipidemia, unspecified: Secondary | ICD-10-CM

## 2024-05-06 DIAGNOSIS — G4733 Obstructive sleep apnea (adult) (pediatric): Secondary | ICD-10-CM

## 2024-05-06 DIAGNOSIS — R5383 Other fatigue: Secondary | ICD-10-CM

## 2024-05-06 DIAGNOSIS — Z6838 Body mass index (BMI) 38.0-38.9, adult: Secondary | ICD-10-CM

## 2024-05-06 DIAGNOSIS — Z1231 Encounter for screening mammogram for malignant neoplasm of breast: Secondary | ICD-10-CM | POA: Diagnosis not present

## 2024-05-06 MED ORDER — METFORMIN HCL ER 500 MG PO TB24: 500.0000 mg | ORAL_TABLET | Freq: Every day | ORAL | 1 refills | Status: AC

## 2024-05-06 NOTE — Assessment & Plan Note (Signed)
 Checking CBC and iron panel today to screen for anemia.  - Review lab results and communicate findings via MyChart.

## 2024-05-06 NOTE — Assessment & Plan Note (Signed)
 Patient does not want to use CPAP. Discussed the importance of treating OSA to avoid complications such as increased risk of motor vehicle crashes associated with untreated OSA or cardiovascular disease like HTN, arrhythmia, stroke, MI.   - Discussed modest weight loss may improve sleep apnea symptoms. - Monitor weight loss as it may improve sleep apnea symptoms.

## 2024-05-06 NOTE — Progress Notes (Signed)
 Complete physical exam  Patient: Kathleen Ramos   DOB: 1976/07/11   48 y.o. Female  MRN: [id number]  Subjective:    Chief Complaint  Patient presents with   New Patient (Initial Visit)    Transfer of Care    History of Present Illness   Kathleen Ramos is a 48 year old female with PCOS who presents for CPE  Fatigue and pica - Fatigue present, not typical for her baseline - Unusual craving for ice (pica) - Not currently taking iron supplements  Menstrual irregularity and polycystic ovary syndrome (pcos) - History of polycystic ovary syndrome with irregular menstrual cycles - Previously prescribed metformin  but has not taken it due to concerns about gastrointestinal side effects observed in her mother - No diagnosis of diabetes - Last hemoglobin A1c was 5.5 (normal)  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, partly due to PCOS - Post-surgery, takes pantoprazole  (Protonix ) for reflux as needed and a multivitamin  Obstructive sleep apnea - Diagnosed with sleep apnea following a sleep study - Attempts to obtain insurance coverage for weight loss medications under PCOS and sleep apnea diagnoses have been denied  Preventive health maintenance - Received influenza vaccination earlier this week - Due for mammogram in December - Due for Pap smear, follows with OB for this - Due for first colonoscopy as she is over 33 years old          Most recent fall risk assessment:    05/06/2024    8:56 AM  Fall Risk   Falls in the past year? 0  Injury with Fall? 0  Risk for fall due to : No Fall Risks  Follow up Falls evaluation completed     Most recent depression screenings:    05/06/2024    8:56 AM 09/29/2023    2:36 PM  PHQ 2/9 Scores  PHQ - 2 Score 0 3  PHQ- 9 Score 1 8    Vision:Within last year and Dental: No current dental problems and Receives regular dental care    Patient Care Team: Gayle Saddie JULIANNA DEVONNA  as PCP - General (Physician Assistant) Kit Rush, MD as Consulting Physician (Orthopedic Surgery) Jonel Rockie BIRCH, NP (Family Medicine)   Outpatient Medications Prior to Visit  Medication Sig   Multiple Vitamin (MULTIVITAMIN ADULT PO) Take 1 tablet by mouth daily.   [DISCONTINUED] calcium carbonate (TUMS EX) 750 MG chewable tablet Chew by mouth.   [DISCONTINUED] metFORMIN  (GLUCOPHAGE ) 500 MG tablet Take 2 tablets (1,000 mg total) by mouth 2 (two) times daily with a meal.   [DISCONTINUED] pantoprazole  (PROTONIX ) 40 MG tablet Take 1 tablet (40 mg total) by mouth daily.   No facility-administered medications prior to visit.    ROS   Per HPI     Objective:     BP 119/75   Pulse 64   Temp 97.6 F (36.4 C) (Oral)   Ht 5' 7 (1.702 m)   Wt 253 lb 1.9 oz (114.8 kg)   LMP 04/15/2024   SpO2 98%   BMI 39.64 kg/m    Physical Exam Constitutional:      General: She is not in acute distress.    Appearance: Normal appearance.  Eyes:     Pupils: Pupils are equal, round, and reactive to light.  Cardiovascular:     Rate and Rhythm: Normal rate and regular rhythm.     Heart sounds: Normal heart sounds. No murmur heard.    No friction  rub. No gallop.  Pulmonary:     Effort: Pulmonary effort is normal. No respiratory distress.     Breath sounds: Normal breath sounds.  Musculoskeletal:        General: No swelling.     Cervical back: Neck supple.  Lymphadenopathy:     Cervical: No cervical adenopathy.  Skin:    General: Skin is warm and dry.  Neurological:     General: No focal deficit present.     Mental Status: She is alert.  Psychiatric:        Mood and Affect: Mood normal.        Behavior: Behavior normal.        Thought Content: Thought content normal.       No results found for any visits on 05/06/24.     Assessment & Plan:    Routine Health Maintenance and Physical Exam  Health Maintenance  Topic Date Due   Hepatitis B Vaccine (1 of 3 - 19+ 3-dose series)  Never done   Colon Cancer Screening  Never done   COVID-19 Vaccine (3 - 2025-26 season) 04/04/2024   Pap with HPV screening  06/01/2024   Breast Cancer Screening  07/30/2025   DTaP/Tdap/Td vaccine (2 - Td or Tdap) 05/22/2029   Flu Shot  Completed   Hepatitis C Screening  Completed   HIV Screening  Completed   Pneumococcal Vaccine  Aged Out   HPV Vaccine  Aged Out   Meningitis B Vaccine  Aged Out    Discussed health benefits of physical activity, and encouraged her to engage in regular exercise appropriate for her age and condition.  Screening mammogram for breast cancer -     3D Screening Mammogram, Left and Right; Future  Screen for colon cancer -     Ambulatory referral to Gastroenterology  Prediabetes -     Hemoglobin A1c; Future -     Lipid panel; Future  BMI 38.0-38.9,adult Assessment & Plan: Obesity post gastric sleeve surgery. Denied coverage for GLP-1 agonists under PCOS diagnosis and sleep apnea.  - Monitor weight and response to metformin  for potential weight loss benefits.  Orders: -     VITAMIN D  25 Hydroxy (Vit-D Deficiency, Fractures); Future -     TSH; Future -     Hemoglobin A1c; Future -     Lipid panel; Future -     Comprehensive metabolic panel with GFR; Future -     CBC with Differential/Platelet; Future  PCOS (polycystic ovarian syndrome) Assessment & Plan: PCOS with insulin  resistance. Discussed metformin  benefits for insulin  resistance and weight loss. Explained extended release formulation reduces gastrointestinal side effects. - Prescribe metformin  extended release at lowest dose (500 mg) for trial over weekend. - Monitor response to metformin  and adjust dosage if well tolerated. - Continue follow ups with OB as scheduled  Orders: -     Iron, TIBC and Ferritin Panel; Future  Obstructive sleep apnea Assessment & Plan: Patient does not want to use CPAP. Discussed the importance of treating OSA to avoid complications such as increased risk of  motor vehicle crashes associated with untreated OSA or cardiovascular disease like HTN, arrhythmia, stroke, MI.   - Discussed modest weight loss may improve sleep apnea symptoms. - Monitor weight loss as it may improve sleep apnea symptoms.   Other fatigue Assessment & Plan: Checking CBC and iron panel today to screen for anemia.  - Review lab results and communicate findings via MyChart.   Hyperlipidemia, unspecified hyperlipidemia type Assessment &  Plan: Updating lipid panel with labs. The 10-year ASCVD risk score (Arnett DK, et al., 2019) is: 0.7% Will follow up with results and treat as indicated. Continue with low fat diet and exercise.   Wellness examination Assessment & Plan: Flu vaccination received. Mammogram due December, Pap smear due soon, colon cancer screening recommended. - Order mammogram at [facility]. - Updating FBW today, will f/u with reuslts - Advise scheduling Pap smear with OB/GYN. - Order colonoscopy for colon cancer screening.    Other orders -     metFORMIN  HCl ER; Take 1 tablet (500 mg total) by mouth daily with breakfast.  Dispense: 90 tablet; Refill: 1   Return in about 6 months (around 11/04/2024) for HLD, PCOS .     Saddie JULIANNA Sacks, PA-C

## 2024-05-06 NOTE — Assessment & Plan Note (Signed)
 Obesity post gastric sleeve surgery. Denied coverage for GLP-1 agonists under PCOS diagnosis and sleep apnea.  - Monitor weight and response to metformin  for potential weight loss benefits.

## 2024-05-06 NOTE — Assessment & Plan Note (Signed)
 Updating lipid panel with labs. The 10-year ASCVD risk score (Arnett DK, et al., 2019) is: 0.7% Will follow up with results and treat as indicated. Continue with low fat diet and exercise.

## 2024-05-06 NOTE — Assessment & Plan Note (Signed)
 Flu vaccination received. Mammogram due December, Pap smear due soon, colon cancer screening recommended. - Order mammogram at [facility]. - Updating FBW today, will f/u with reuslts - Advise scheduling Pap smear with OB/GYN. - Order colonoscopy for colon cancer screening.

## 2024-05-06 NOTE — Assessment & Plan Note (Signed)
 PCOS with insulin  resistance. Discussed metformin  benefits for insulin  resistance and weight loss. Explained extended release formulation reduces gastrointestinal side effects. - Prescribe metformin  extended release at lowest dose (500 mg) for trial over weekend. - Monitor response to metformin  and adjust dosage if well tolerated. - Continue follow ups with OB as scheduled

## 2024-05-06 NOTE — Patient Instructions (Signed)
 VISIT SUMMARY: Today, you were seen for fatigue and unusual cravings for ice, which may be related to iron deficiency anemia. We also discussed your history of polycystic ovary syndrome (PCOS), post-bariatric surgery status, and obstructive sleep apnea. Preventive health maintenance was reviewed, including upcoming screenings.  YOUR PLAN: -POLYCYSTIC OVARY SYNDROME (PCOS) WITH INSULIN  RESISTANCE: PCOS is a hormonal disorder causing enlarged ovaries with small cysts. We discussed starting metformin  extended release at the lowest dose to help with insulin  resistance and potential weight loss. This formulation should reduce gastrointestinal side effects. We will monitor your response and adjust the dosage if it is well tolerated.  -OBSTRUCTIVE SLEEP APNEA: Sleep apnea is a condition where breathing repeatedly stops and starts during sleep. Weight loss may improve your symptoms, so we will monitor your weight.  -SUSPECTED IRON DEFICIENCY ANEMIA: Iron deficiency anemia occurs when your body lacks enough iron to produce adequate healthy red blood cells. We will conduct a complete blood count and iron panel to confirm this. Once the lab results are available, we will review and communicate the findings through MyChart.  -GENERAL HEALTH MAINTENANCE: You received your flu vaccination. You are due for a mammogram in December, a Pap smear soon, and your first colonoscopy as you are over 7 years old. We will order the mammogram and colonoscopy, and you should schedule the Pap smear with your OB/GYN.  INSTRUCTIONS: Please start taking the metformin  extended release as prescribed and monitor how you feel. Schedule your Pap smear with your OB/GYN and follow up on the mammogram and colonoscopy orders. We will review your lab results and communicate the findings via MyChart.  If you have any problems before your next visit feel free to message me via MyChart (minor issues or questions) or call the office, otherwise  you may reach out to schedule an office visit.  Thank you! Saddie Sacks, PA-C

## 2024-05-07 LAB — IRON,TIBC AND FERRITIN PANEL
Ferritin: 24 ng/mL (ref 15–150)
Iron Saturation: 17 % (ref 15–55)
Iron: 69 ug/dL (ref 27–159)
Total Iron Binding Capacity: 396 ug/dL (ref 250–450)
UIBC: 327 ug/dL (ref 131–425)

## 2024-05-07 LAB — COMPREHENSIVE METABOLIC PANEL WITH GFR
ALT: 9 IU/L (ref 0–32)
AST: 17 IU/L (ref 0–40)
Albumin: 3.8 g/dL — ABNORMAL LOW (ref 3.9–4.9)
Alkaline Phosphatase: 79 IU/L (ref 41–116)
BUN/Creatinine Ratio: 22 (ref 9–23)
BUN: 14 mg/dL (ref 6–24)
Bilirubin Total: 0.4 mg/dL (ref 0.0–1.2)
CO2: 23 mmol/L (ref 20–29)
Calcium: 9 mg/dL (ref 8.7–10.2)
Chloride: 104 mmol/L (ref 96–106)
Creatinine, Ser: 0.63 mg/dL (ref 0.57–1.00)
Globulin, Total: 3.1 g/dL (ref 1.5–4.5)
Glucose: 86 mg/dL (ref 70–99)
Potassium: 4.3 mmol/L (ref 3.5–5.2)
Sodium: 140 mmol/L (ref 134–144)
Total Protein: 6.9 g/dL (ref 6.0–8.5)
eGFR: 111 mL/min/1.73 (ref >59)

## 2024-05-07 LAB — CBC WITH DIFFERENTIAL/PLATELET
Basophils Absolute: 0 x10E3/uL (ref 0.0–0.2)
Basos: 1 %
EOS (ABSOLUTE): 0.1 x10E3/uL (ref 0.0–0.4)
Eos: 1 %
Hematocrit: 36.6 % (ref 34.0–46.6)
Hemoglobin: 11.1 g/dL (ref 11.1–15.9)
Immature Grans (Abs): 0 x10E3/uL (ref 0.0–0.1)
Immature Granulocytes: 0 %
Lymphocytes Absolute: 2.2 x10E3/uL (ref 0.7–3.1)
Lymphs: 39 %
MCH: 24.3 pg — ABNORMAL LOW (ref 26.6–33.0)
MCHC: 30.3 g/dL — ABNORMAL LOW (ref 31.5–35.7)
MCV: 80 fL (ref 79–97)
Monocytes Absolute: 0.4 x10E3/uL (ref 0.1–0.9)
Monocytes: 7 %
Neutrophils Absolute: 2.9 x10E3/uL (ref 1.4–7.0)
Neutrophils: 51 %
Platelets: 285 x10E3/uL (ref 150–450)
RBC: 4.56 x10E6/uL (ref 3.77–5.28)
RDW: 16 % — ABNORMAL HIGH (ref 11.7–15.4)
WBC: 5.7 x10E3/uL (ref 3.4–10.8)

## 2024-05-07 LAB — HEMOGLOBIN A1C
Est. average glucose Bld gHb Est-mCnc: 120 mg/dL
Hgb A1c MFr Bld: 5.8 % — ABNORMAL HIGH (ref 4.8–5.6)

## 2024-05-07 LAB — LIPID PANEL
Chol/HDL Ratio: 3.9 ratio (ref 0.0–4.4)
Cholesterol, Total: 228 mg/dL — ABNORMAL HIGH (ref 100–199)
HDL: 58 mg/dL (ref >39)
LDL Chol Calc (NIH): 160 mg/dL — ABNORMAL HIGH (ref 0–99)
Triglycerides: 59 mg/dL (ref 0–149)
VLDL Cholesterol Cal: 10 mg/dL (ref 5–40)

## 2024-05-07 LAB — TSH: TSH: 1.09 u[IU]/mL (ref 0.450–4.500)

## 2024-05-07 LAB — VITAMIN D 25 HYDROXY (VIT D DEFICIENCY, FRACTURES): Vit D, 25-Hydroxy: 21.6 ng/mL — ABNORMAL LOW (ref 30.0–100.0)

## 2024-05-09 ENCOUNTER — Ambulatory Visit: Payer: Self-pay

## 2024-07-07 ENCOUNTER — Other Ambulatory Visit: Payer: Self-pay

## 2024-07-07 DIAGNOSIS — Z6838 Body mass index (BMI) 38.0-38.9, adult: Secondary | ICD-10-CM

## 2024-07-07 MED ORDER — PHENTERMINE HCL 37.5 MG PO CAPS: 37.5000 mg | ORAL_CAPSULE | ORAL | 0 refills | Status: DC

## 2024-07-07 MED ORDER — PHENTERMINE HCL 37.5 MG PO CAPS: 37.5000 mg | ORAL_CAPSULE | ORAL | 0 refills | Status: AC

## 2024-08-01 ENCOUNTER — Ambulatory Visit
Admission: RE | Admit: 2024-08-01 | Discharge: 2024-08-01 | Disposition: A | Discharge status: Home / Self Care | Source: Ambulatory Visit

## 2024-08-01 DIAGNOSIS — Z1231 Encounter for screening mammogram for malignant neoplasm of breast: Secondary | ICD-10-CM

## 2024-09-06 DIAGNOSIS — Z6838 Body mass index (BMI) 38.0-38.9, adult: Secondary | ICD-10-CM

## 2024-09-08 MED ORDER — WEGOVY 1.5 MG PO TABS: 1.5000 mg | ORAL_TABLET | Freq: Every day | ORAL | 0 refills | Status: AC

## 2024-10-31 ENCOUNTER — Other Ambulatory Visit

## 2024-11-07 ENCOUNTER — Ambulatory Visit
